# Patient Record
Sex: Female | Born: 1985 | Race: White | Hispanic: No | Marital: Married | Smoking: Former smoker
Health system: Southern US, Community
[De-identification: ages and names within clinical notes are randomized; demographics above are authoritative.]

## PROBLEM LIST (undated history)

## (undated) ENCOUNTER — Inpatient Hospital Stay (HOSPITAL_COMMUNITY): Payer: Self-pay

## (undated) DIAGNOSIS — Z789 Other specified health status: Secondary | ICD-10-CM

## (undated) HISTORY — PX: DILATION AND CURETTAGE OF UTERUS: SHX78

---
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
## 2003-06-15 ENCOUNTER — Other Ambulatory Visit: Admission: RE | Admit: 2003-06-15 | Discharge: 2003-06-15 | Payer: Self-pay | Admitting: Obstetrics & Gynecology

## 2004-07-11 ENCOUNTER — Other Ambulatory Visit: Admission: RE | Admit: 2004-07-11 | Discharge: 2004-07-11 | Payer: Self-pay | Admitting: Obstetrics and Gynecology

## 2007-05-28 ENCOUNTER — Ambulatory Visit: Payer: Self-pay | Admitting: Family Medicine

## 2007-05-28 ENCOUNTER — Observation Stay (HOSPITAL_COMMUNITY): Admission: EM | Admit: 2007-05-28 | Discharge: 2007-05-29 | Payer: Self-pay | Admitting: Emergency Medicine

## 2008-01-15 ENCOUNTER — Emergency Department (HOSPITAL_COMMUNITY): Admission: EM | Admit: 2008-01-15 | Discharge: 2008-01-15 | Payer: Self-pay | Admitting: Family Medicine

## 2009-02-28 ENCOUNTER — Emergency Department (HOSPITAL_COMMUNITY): Admission: EM | Admit: 2009-02-28 | Discharge: 2009-02-28 | Payer: Self-pay | Admitting: Family Medicine

## 2010-06-07 ENCOUNTER — Ambulatory Visit (HOSPITAL_COMMUNITY): Admission: AD | Admit: 2010-06-07 | Discharge: 2010-06-07 | Payer: Self-pay | Admitting: Obstetrics & Gynecology

## 2010-06-27 DEATH — deceased

## 2010-11-08 LAB — CBC
MCH: 32.9 pg (ref 26.0–34.0)
MCHC: 34.6 g/dL (ref 30.0–36.0)
MCV: 95.1 fL (ref 78.0–100.0)
Platelets: 258 10*3/uL (ref 150–400)
RDW: 13 % (ref 11.5–15.5)

## 2010-11-08 LAB — URINALYSIS, ROUTINE W REFLEX MICROSCOPIC
Bilirubin Urine: NEGATIVE
Glucose, UA: NEGATIVE mg/dL
Ketones, ur: NEGATIVE mg/dL
Leukocytes, UA: NEGATIVE
Nitrite: NEGATIVE
Protein, ur: NEGATIVE mg/dL
Specific Gravity, Urine: >1.03 — ABNORMAL HIGH (ref 1.005–1.030)
Urobilinogen, UA: 0.2 mg/dL (ref 0.0–1.0)
pH: 6 (ref 5.0–8.0)

## 2010-11-08 LAB — URINE MICROSCOPIC-ADD ON

## 2010-11-29 ENCOUNTER — Other Ambulatory Visit: Payer: Self-pay | Admitting: Obstetrics and Gynecology

## 2010-12-15 ENCOUNTER — Ambulatory Visit (HOSPITAL_COMMUNITY)
Admission: RE | Admit: 2010-12-15 | Discharge: 2010-12-15 | Disposition: A | Discharge status: Home / Self Care | Payer: 59 | Source: Ambulatory Visit | Attending: Obstetrics & Gynecology | Admitting: Obstetrics & Gynecology

## 2010-12-15 ENCOUNTER — Other Ambulatory Visit: Payer: Self-pay | Admitting: Obstetrics & Gynecology

## 2010-12-15 DIAGNOSIS — O021 Missed abortion: Secondary | ICD-10-CM | POA: Insufficient documentation

## 2010-12-15 LAB — CBC
MCHC: 34.6 g/dL (ref 30.0–36.0)
Platelets: 299 10*3/uL (ref 150–400)
RDW: 12.5 % (ref 11.5–15.5)
WBC: 11.4 10*3/uL — ABNORMAL HIGH (ref 4.0–10.5)

## 2010-12-16 NOTE — Op Note (Signed)
  [name], [name]              ACCOUNT NO.:  [id number]  MEDICAL RECORD NO.:  [id number]           PATIENT TYPE:  O  LOCATION:  [facility]                          [facility]  PHYSICIAN:  Gerrit Friends. Aldona Bar, M.D.   DATE OF BIRTH:  [birth year]  DATE OF PROCEDURE:  12/15/2010 DATE OF DISCHARGE:                              OPERATIVE REPORT   PREOPERATIVE DIAGNOSIS:  First trimester pregnancy loss, blood type A positive.  POSTOPERATIVE DIAGNOSIS:  First trimester pregnancy loss, blood type A positive.  PATHOLOGY:  Pending.  PROCEDURE:  Suction, dilatation, and curettage.  SURGEON:  Gerrit Friends. Aldona Bar, MD  ANESTHESIA:  Intravenous conscious sedation.  HISTORY:  This 25 year old gravida 3, para 1-0-4-1 presented to the office on December 13, 2010, having had an ultrasound which was consistent with a first trimester pregnancy loss.  She is now being taken to the OR for a suction, dilatation, and curettage to evacuate this pregnancy.  OPERATIVE PROCEDURE:  The patient was taken to the operating room where after satisfactory induction of intravenous conscious sedation, she was prepped and draped having placed in the modified lithotomy position in short Allen stirrups.  She was prepped and draped in usual fashion.  She previously emptied her bladder in [facility] Admissions and she was not catheterized.  At this time, a speculum was placed and a single-tooth tenaculum placed on the anterior lip of the cervix.  A paracervical block was carried out with approximately 15 mL of 1% Xylocaine without epinephrine.  The internal os was then dilated to a #25 Pratt dilator and thereafter using #8 suction tip, the cavity was thoroughly, gently, and systematically evacuated of all products of conception.  Curettage with a small standard curette confirmed what was felt to be complete evacuation and re-suctioning produced no additional tissue.  At this time, the procedure was felt to be complete.  All  instruments were removed.  The uterus at this time was firm and barely upper limits of normal size.  Bleeding was minimal.  The patient will be discharged to home with appropriate instructions and prescription for doxycycline 100 mg to use twice daily for total of 5 days to avoid infection.  She will return to the office for followup in approximately 2 weeks' time or as needed.  She will be given an instruction sheet at time of discharge from the step-down unit.  CONDITION ON ARRIVAL TO RECOVERY ROOM:  Satisfactory.  Pathologic specimen consisted of products of conception.  All counts correct x2.     Gerrit Friends. Aldona Bar, M.D.     RMW/MEDQ  D:  12/15/2010  T:  12/15/2010  Job:  [id number]  Electronically Signed by Annamaria Helling M.D. on 01/03/2011 07:16:50 AM

## 2010-12-26 DEATH — deceased

## 2011-01-09 NOTE — Discharge Summary (Signed)
[name], [name]               ACCOUNT NO.:  [id number]   MEDICAL RECORD NO.:  [id number]          PATIENT TYPE:  INP   LOCATION:  2031                         FACILITY:  [facility]   PHYSICIAN:  Wayne A. Sheffield Slider, M.D.    DATE OF BIRTH:  28-Jul-1986   DATE OF ADMISSION:  05/28/2007  DATE OF DISCHARGE:  05/29/2007                               DISCHARGE SUMMARY   DISCHARGE DIAGNOSES:  1. Presyncope.  2. Urinary tract infection.   CONSULTS:  None.   PROCEDURES:  1. Chest x-ray showing no acute cardiopulmonary process.  2. EKG showing sinus rhythm with sinus arrhythmias.  3. Repeat EKG in the morning:  No change.   DISCHARGE LABS:  Sodium 138, potassium 3.6, chloride 104, bicarb 26, BUN  11, creatinine 0.72, glucose 88, calcium 9.5, total bilirubin 0.5.  ALP  54, AST 14, ALT 26, total protein 6.9, albumin 3.8.  White blood cells  11.2, hemoglobin 14, hematocrit 40.6, platelets 268.  Urine pregnancy  test negative.  D-dimer less than 0.22.  Cardiac enzymes negative x2.  TSH 0.77.  Urine drug screen negative.  Urinalysis showing large  leukocyte esterase, many bacteria, many epithelial cells and 7-10 white  blood cells.   BRIEF HISTORY AND PHYSICAL:  For full summary, please see dictated H&P.  In short, this is a 25 year old female with no significant past medical  history who presents with presyncope.   1. Presyncope, likely vasovagal versus hypoglycemic given patient's      associated multiple episodes of presyncope with decreased p.o.      intake during the day, and patient was for the most part standing      up during episodes.  EKG showing no cardiac etiology.  Given      patient's history of similar episodes of syncope and presyncope in      daughter and history of recurrent presyncopal episodes in patient,      consider cardiology referral as outpatient.  Patient to follow up      within two weeks at [facility].  Cardiac workup during this      hospitalization nonrevealing.  2.  Urinary tract infection.  During hospitalization, patient was found      to have a urinary tract infection.  Keflex 500 mg t.i.d. was      started.  Patient to complete a seven-day course.   DISCHARGE MEDICATIONS:  1. Keflex 500 mg p.o. t.i.d. x7 days.  2. Implanon.   FOLLOWUP:  Patient to follow up with Dr. Corliss Marcus on Thursday,  October 9, at 11:15 a.m. at the family practice center.   DISCHARGE INSTRUCTIONS:  1. Please ensure you eat adequately.  2. Call your doctor or return to the ER when or if you again feel      dizzy like you did, or if you feel like you are going to pass out.  3. We would like you to follow up with [place] within the next two weeks;      please arrive 15 minutes prior to appointment to fill out      paperwork, as you will be  a new patient.   ISSUES FOR FOLLOWUP:  1. Resolution of UTI.  2. The 2-D echo results.  3. Consider cardiology referral.      Norma Boyden, MD  Electronically Signed      Norma [name]. Sheffield Slider, M.D.  Electronically Signed    JG/MEDQ  D:  05/29/2007  T:  05/29/2007  Job:  [id number]

## 2011-01-09 NOTE — H&P (Signed)
Norma [name], CENTNER NO.:  [id number]   MEDICAL RECORD NO.:  [id number]          PATIENT TYPE:  EMS   LOCATION:  [facility]                         FACILITY:  [facility]   PHYSICIAN:  Alanson Puls, M.D.    DATE OF BIRTH:  1986-06-08   DATE OF ADMISSION:  05/28/2007  DATE OF DISCHARGE:                              HISTORY & PHYSICAL   PRIMARY CARE PHYSICIAN:  None.   CHIEF COMPLAINT:  Recurrent syncope.   HISTORY OF PRESENT ILLNESS:  This is 25 year old with recurrent syncopal  episodes.  The patient had a syncopal episode today while at work.  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  The patient  states that her body went limp and that she felt faint.  There was no  loss of consciousness and this episode lasted about 10-15 minutes.  Per  report, the patient appeared pale to her colleagues and had a heart rate  in the 40s with a blood pressure 102/62, sating 97% on room air and her  blood sugar was checked and was 99.  The patient states this is at least  the third episode in one year.  These episodes usually occur every 4-5  months.  She states that they are usually associated with decreased p.o.  intake, and she thought this was secondary to hypoglycemia.  She has no  history of seizure or migraine.  She has never had any loss of bowel or  bladder or loss of consciousness with these episodes.  She denies any  chest pain, palpitations or shortness of breath, and her EKG in the  emergency department showed sinus arrhythmia with a sinus rhythm at 68  beats per minute and normal intervals.   PAST MEDICAL HISTORY:  Gestational diabetes with birth of a live child  in 2004.   PAST SURGICAL HISTORY:  She has had no surgeries.   MEDICATIONS:  Implanon which was implanted in August 2007.   FAMILY HISTORY:  Significant for  mother with hypercholesterolemia.  Father with hypertension and a brother who is healthy.  She does have an  aunt who had coronary artery disease status post CABG around age 53.  Her paternal grandfather had heart valve replacement in his 6s as well  as maternal grandfather who had a stroke as well as MI by the age of 66.   SOCIAL HISTORY:  The patient does live in [place] with her fiance and  smokes two cigarettes daily for the past 15 years she denies any alcohol  or marijuana use.  States that she is under significant stress with her  69-year-old daughter who is having some type of arrhythmia as well, but  her arrhythmia seems to be more of a tachycardia, and she is being  followed by a pediatric cardiologist.   ALLERGIES:  No known drug allergies.   REVIEW OF SYSTEMS:  Significant for 17 pounds weight loss within last 2  months.  The patient states some of this is intentional.  However, she  does not admit to purging or binging.  She states she has also had  occasional nonbilious nonbloody emesis at least weekly, but no changes  in her stool.  She states that this is not related to any particular  meal.   PHYSICAL EXAMINATION:  VITAL SIGNS:  Temperature 97, blood pressure  111/59, heart rate 64, respirations 20, 97% on room air.  Orthostatic  vitals were obtained.  Her lying blood pressure was 113/55, heart rate  of 64, standing 113/63 with a heart rate of 81.  In general, she is  alert and awake and oriented in no acute distress.  HEENT: Pupils are equal, round and reactive.  Extraocular muscles are  intact.  Visual fields are to confrontation.  CARDIAC:  About 68-70 beats per minute.  No murmurs appreciated.  No  gallops appreciated.  No carotid bruits.  PULMONARY:  She is clear to auscultation bilaterally.  ABDOMEN:  Soft, nontender with positive bowel sounds and no guarding.  EXTREMITIES:  No edema or swelling noted.  NEUROLOGICAL:  Deep tendon reflexes 2+ with down going  Babinski sign,  5/5 upper extremity strength and lower extremity strength and finger-to-  nose was intact.   LABORATORY DATA:  White blood cell count 9.5, hemoglobin 13.7, platelets  258, 62% segs.  Sodium 138, potassium 3.5, chloride 104, bicarb 25, BUN  10, creatinine 0.7, glucose of 100, magnesium 2.1.  Point of care  enzymes:  Myoglobin 50.8, second set 61.9, troponin I less than 0.05  both sets and CK-MB less than one both sets.  Her EKG here showed normal  sinus rhythm at 63 beats per minute with a PR of 146, a QRS of 88 and  QTc of 388.  Chest x-ray showed no acute cardiopulmonary abnormalities.   ASSESSMENT/PLAN:  This is a 25 year old with syncope.  We want to rule  out a cardiovascular cause given her young age and with intermittent  bradycardia as reported.  We will cycle her cardiac enzymes q.8 x2.  She  did have a normal cardiac and neurological exam.  We will order an echo  to evaluate for valvular abnormalities versus structural cause.  We will  also check a D-dimer given she has a low probability, but she has  tobacco use as well as hormone use with progestin through her Implanon.  There is a question of whether her orthostatics are borderline.  However, we will encourage p.o. hydration given that she wants to eat.  We will further risk stratify her by checking a cholesterol given family  history of premature coronary artery disease as well as check a TSH  given report of bradycardia in a urine drug screen.  We will rule out  pregnancy by checking a urine pregnancy.   DISPOSITION:  Pending evaluation of her syncope.      Alanson Puls, M.D.  Electronically Signed     MR/MEDQ  D:  05/28/2007  T:  05/29/2007  Job:  [id number]

## 2011-01-12 NOTE — Discharge Summary (Signed)
NAME:  KY, MOSKOWITZ                         ACCOUNT NO.:  [id number]   MEDICAL RECORD NO.:  [id number]                   PATIENT TYPE:  INP   LOCATION:  9157                                 [facility]   PHYSICIAN:  Gerrit Friends. Aldona Bar, M.D.                DATE OF BIRTH:  08-14-1986   DATE OF ADMISSION:  11/13/2002  DATE OF DISCHARGE:  11/17/2002                                 DISCHARGE SUMMARY   DISCHARGE DIAGNOSES:  1. Thirty-three week pregnancy, undelivered.  2. Blood type A positive.  3. Preterm labor, arrested.   SUMMARY:  This 25 year old primigravida with a due date of Jan 03, 2003  presented with contractions.  Her pregnancy has been complicated by diet  controlled gestational diabetes.  At the time of admission she was felt to  be 2-3 cm dilated, 50% effaced, with a vertex at -1 station.  Fetal heart  was reactive and regular contractions were noted.  She was admitted,  cultured for group B strep, placed on penicillin prophylaxis, begun on  magnesium sulfate, and given betamethasone.  She responded well to the  magnesium sulfate.  Her contractions decreased in frequency.  On March 21  she was begun on Procardia 20 mg three times a day.  Her group B strep  culture was negative and penicillin was discontinued.  On March 22 she was  having very occasional contractions.  She was doing well on Procardia.  She  was having some variables on the fetal heart monitor.  A biophysical profile  was done and was 8/10 (-2 for breathing).  Fetal Dopplers were normal.  Amniotic fluid volume was felt to be decreased.  She was hydrated with IV  fluids on March 22.  On the morning of March 23 the baby was quite reactive  and no decelerations were noted on the fetal monitor, and she was having  rare contractions.  She was examined and her cervix was found to be 1-2 cm  dilated, about 70% effaced, with the vertex at -1 station.  It was felt at  this point that maximum hospitalization had  been achieved.  She does not  live far away from the hospital and therefore was discharged to home at  relative rest, to continue her Procardia 20 mg q.8h., and return as needed  or return to the office in approximately a week's time.  She was given  careful, explicit instructions at the time of discharge and understood all  instructions well.   MEDICATIONS AT TIME OF DISCHARGE:  1. Prenatal vitamins one a day.  2. Procardia 20 mg q.8h.   CONDITION ON DISCHARGE:  Improved.  Gerrit Friends. Aldona Bar, M.D.    RMW/MEDQ  D:  11/17/2002  T:  11/17/2002  Job:  [id number]

## 2011-06-07 LAB — DIFFERENTIAL
Basophils Relative: 1
Lymphocytes Relative: 31
Monocytes Relative: 6
Neutro Abs: 5.9
Neutrophils Relative %: 62

## 2011-06-07 LAB — LIPID PANEL
Cholesterol: 135
HDL: 21 — ABNORMAL LOW
Triglycerides: 139

## 2011-06-07 LAB — I-STAT 8, (EC8 V) (CONVERTED LAB)
Acid-base deficit: 1
Operator id: 285841
Potassium: 3.5
TCO2: 27
pCO2, Ven: 45.4
pH, Ven: 7.353 — ABNORMAL HIGH

## 2011-06-07 LAB — RAPID URINE DRUG SCREEN, HOSP PERFORMED: Tetrahydrocannabinol: NOT DETECTED

## 2011-06-07 LAB — COMPREHENSIVE METABOLIC PANEL
ALT: 26
AST: 14
Alkaline Phosphatase: 54
CO2: 26
Chloride: 104
Creatinine, Ser: 0.72
GFR calc Af Amer: >60
GFR calc non Af Amer: >60
Total Bilirubin: 0.5

## 2011-06-07 LAB — POCT I-STAT CREATININE
Creatinine, Ser: 0.7
Operator id: 285841

## 2011-06-07 LAB — URINALYSIS, ROUTINE W REFLEX MICROSCOPIC
Nitrite: NEGATIVE
Specific Gravity, Urine: 1.012
Urobilinogen, UA: 0.2

## 2011-06-07 LAB — TROPONIN I: Troponin I: 0.01

## 2011-06-07 LAB — CBC
MCHC: 34.5
MCV: 91.7
RBC: 4.26
RBC: 4.43
WBC: 11.2 — ABNORMAL HIGH
WBC: 9.5

## 2011-06-07 LAB — POCT CARDIAC MARKERS
CKMB, poc: <1 — ABNORMAL LOW
Operator id: 285841
Troponin i, poc: <0.05
Troponin i, poc: <0.05

## 2011-06-07 LAB — URINE MICROSCOPIC-ADD ON

## 2011-06-07 LAB — CARDIAC PANEL(CRET KIN+CKTOT+MB+TROPI): Relative Index: INVALID

## 2011-06-07 LAB — TSH: TSH: 0.771

## 2011-06-21 IMAGING — US US OB COMP LESS 14 WK
1 series · 14 of 28 positions shown · non-contrast
Comparison: none

CLINICAL DATA: Vaginal bleeding.  First trimester pregnancy.

OBSTETRIC <14 WK US AND TRANSVAGINAL OB US
TECHNIQUE: Both transabdominal and transvaginal ultrasound
examinations were performed for complete evaluation of the
gestation as well as the maternal uterus, adnexal regions, and
pelvic cul-de-sac.

[Series 1: us ob comp less 14 wks · 0.19mm/px · 37 acquisitions, 14 frames shown]
[im 2/37]
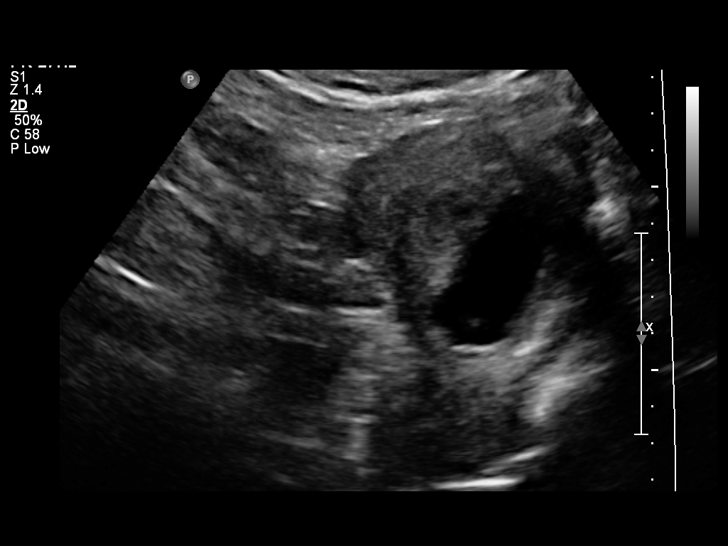
[im 5/37]
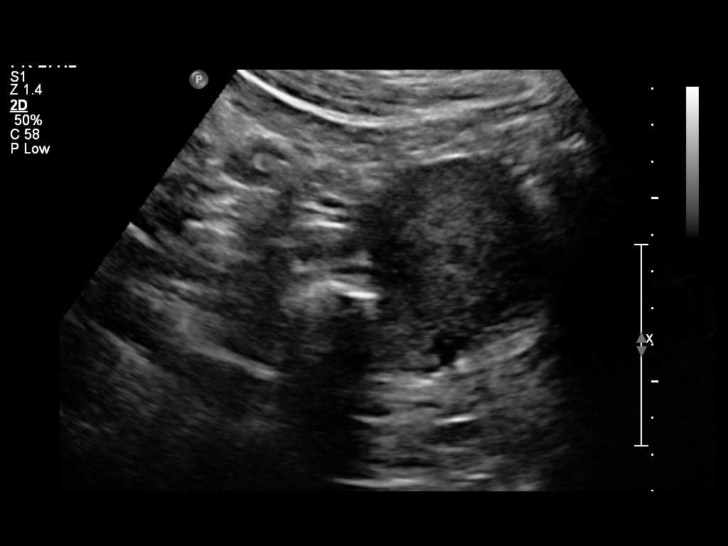
[im 7/37]
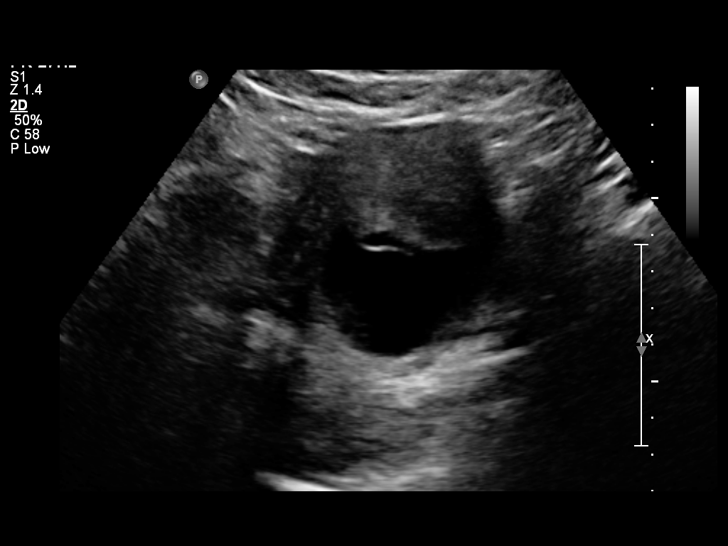
[im 10/37]
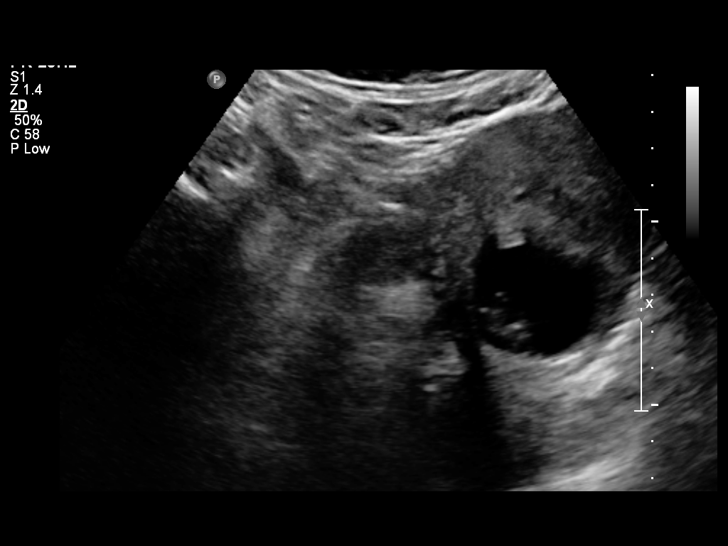
[im 13/37]
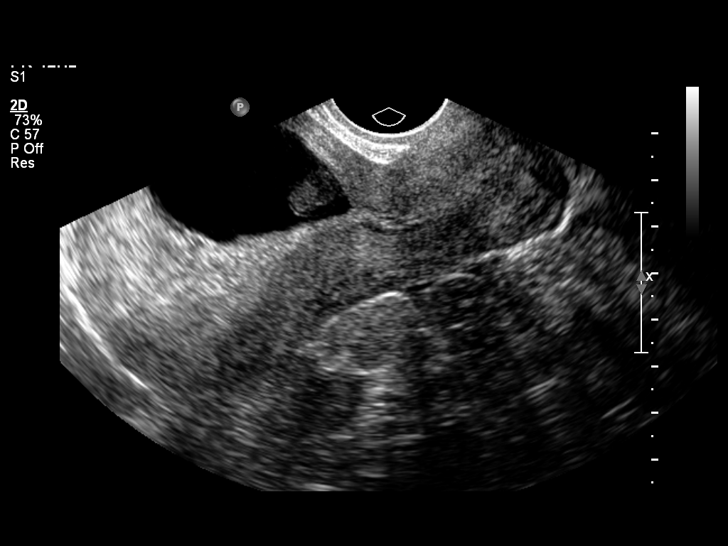
[im 15/37]
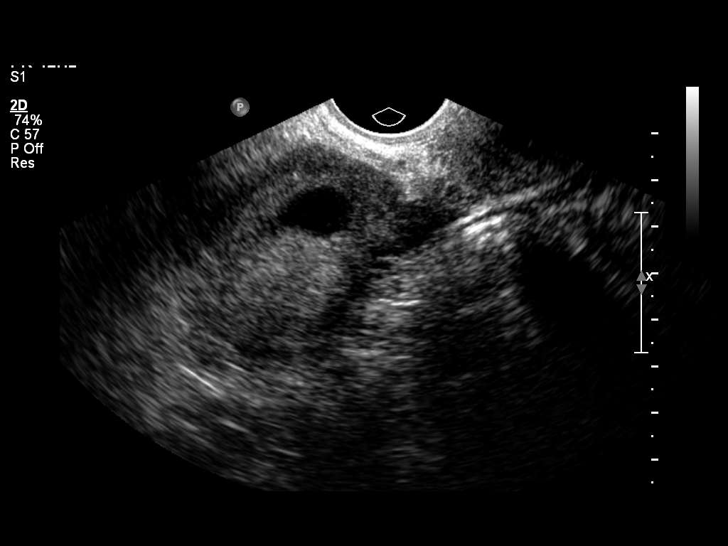
[im 18/37]
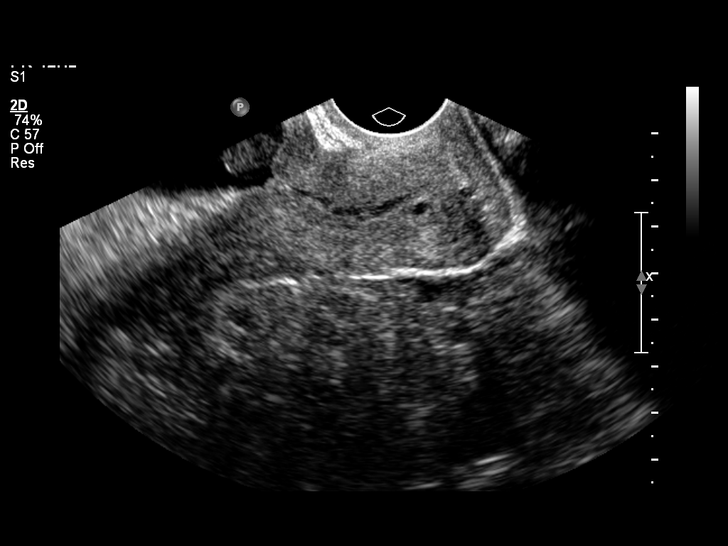
[im 21/37]
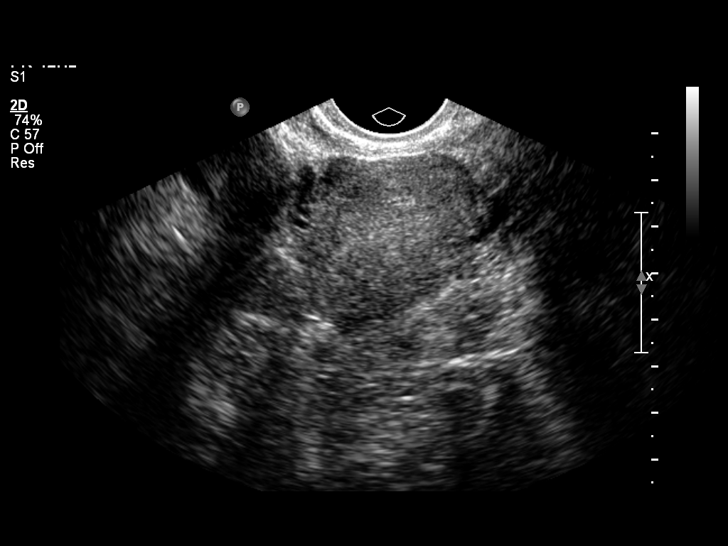
[im 23/37]
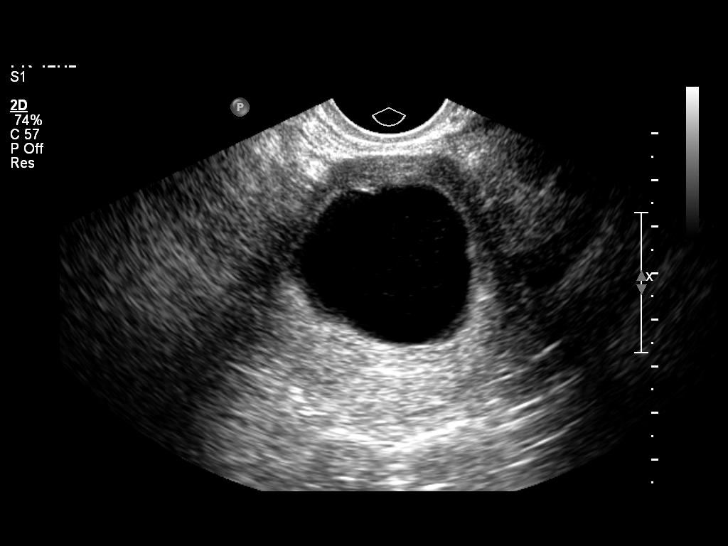
[im 26/37]
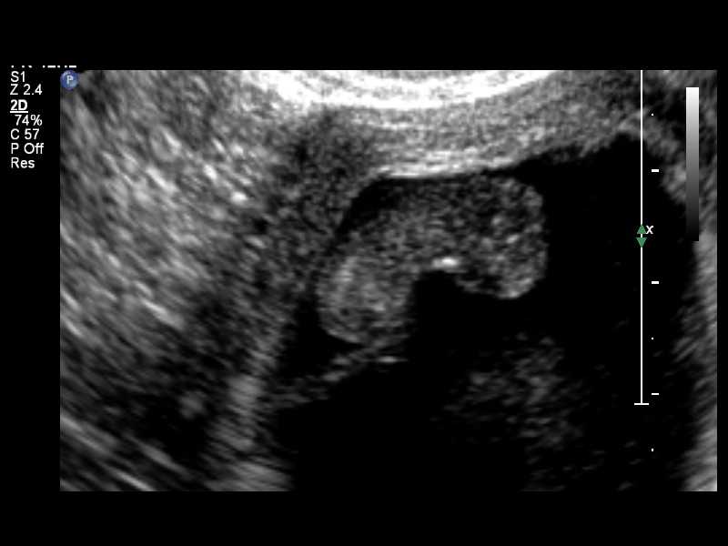
[im 29/37]
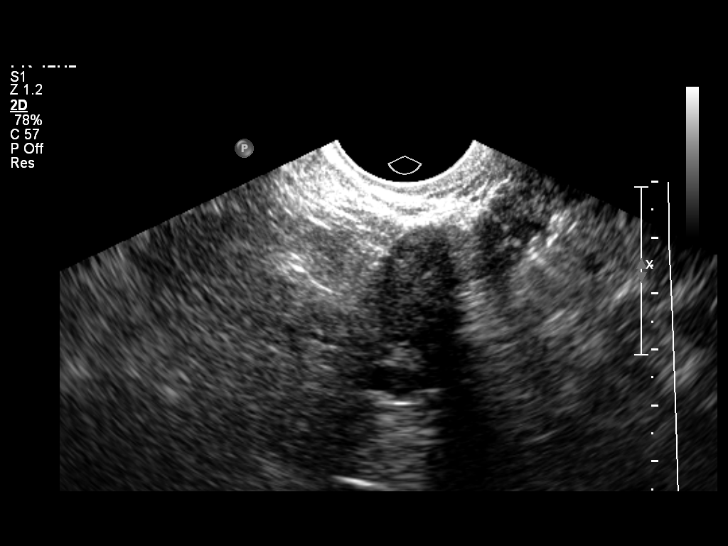
[im 31/37]
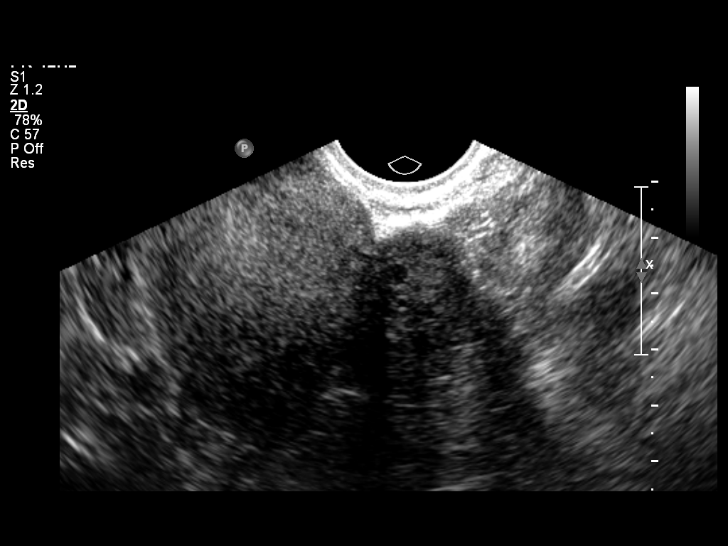
[im 34/37]
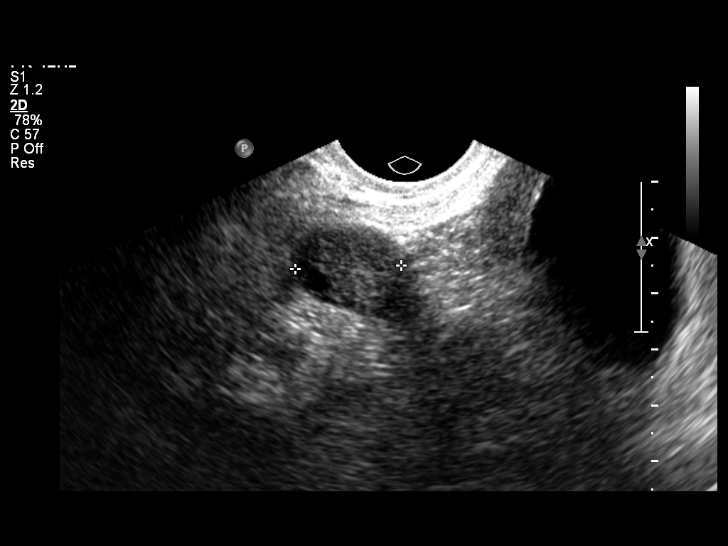
[im 37/37]
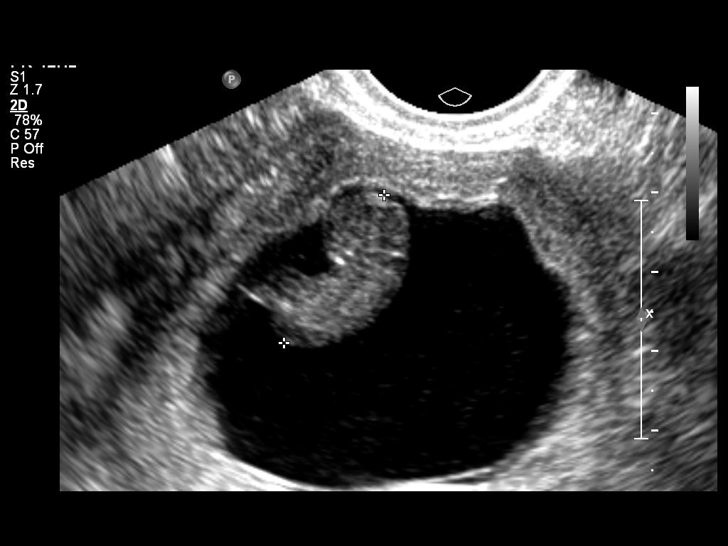

[14 of 28 positions shown; findings below may reference images not displayed]

FINDINGS: An irregular intrauterine gestational sac is identified.

Within this sac, an embryo is noted, with crown-rump length of
cm which would be compatible with 9 weeks 0 days gestation.
However, no embryonic cardiac activity is identified.  The
appearance is diagnostic of demise.

Yolk sac not visualized.

No obvious subchorionic hemorrhage is currently identified.
Maternal ovaries appear unremarkable.

No free pelvic fluid identified.
IMPRESSION: 1.  Irregular intrauterine gestational sac, with embryo measuring 9
weeks 0 days, but with no embryonic cardiac activity.  The
appearance is diagnostic for demise.

## 2011-06-27 ENCOUNTER — Inpatient Hospital Stay (HOSPITAL_COMMUNITY)
Admission: AD | Admit: 2011-06-27 | Discharge: 2011-06-27 | Disposition: A | Discharge status: Home / Self Care | Payer: 59 | Source: Ambulatory Visit | Attending: Obstetrics & Gynecology | Admitting: Obstetrics & Gynecology

## 2011-06-27 ENCOUNTER — Encounter (HOSPITAL_COMMUNITY): Payer: Self-pay | Admitting: *Deleted

## 2011-06-27 DIAGNOSIS — O26899 Other specified pregnancy related conditions, unspecified trimester: Secondary | ICD-10-CM

## 2011-06-27 DIAGNOSIS — O99891 Other specified diseases and conditions complicating pregnancy: Secondary | ICD-10-CM | POA: Insufficient documentation

## 2011-06-27 DIAGNOSIS — R109 Unspecified abdominal pain: Secondary | ICD-10-CM

## 2011-06-27 DIAGNOSIS — N949 Unspecified condition associated with female genital organs and menstrual cycle: Secondary | ICD-10-CM | POA: Insufficient documentation

## 2011-06-27 HISTORY — DX: Other specified health status: Z78.9

## 2011-06-27 LAB — URINALYSIS, ROUTINE W REFLEX MICROSCOPIC
Nitrite: NEGATIVE
Protein, ur: NEGATIVE mg/dL
Specific Gravity, Urine: >1.03 — ABNORMAL HIGH (ref 1.005–1.030)
Urobilinogen, UA: 0.2 mg/dL (ref 0.0–1.0)

## 2011-06-27 LAB — URINE MICROSCOPIC-ADD ON

## 2011-06-27 NOTE — ED Provider Notes (Signed)
History     CSN: [id number] Arrival date & time: 06/27/2011  8:07 PM   None     Chief Complaint  Patient presents with  . Vaginal Pain    HPI Norma [name] is a 25 y.o. female @ 8.[redacted] weeks gestation by her LMP of 04/28/11. Had first prenatal visit 06/19/11 in the office and had viable IUP on ultrasound. History of SAB x 2. Last night began having mild lower abdominal pain but today pain has gotten worse. Patient very concerned that she may be trying to miscarry again. Denies bleeding or discharge. The history was provided by the patient.   No past medical history on file.  No past surgical history on file.  No family history on file.  History  Substance Use Topics  . Smoking status: Not on file  . Smokeless tobacco: Not on file  . Alcohol Use: Not on file    OB History    Grav Para Term Preterm Abortions TAB SAB Ect Mult Living   1               Review of Systems  Constitutional: Negative for fever, chills, diaphoresis and fatigue.  HENT: Negative for ear pain, congestion, sore throat, facial swelling, neck pain, neck stiffness, dental problem and sinus pressure.   Eyes: Negative for photophobia, pain and discharge.  Respiratory: Negative for cough, chest tightness and wheezing.   Cardiovascular: Negative.   Gastrointestinal: Positive for nausea and abdominal pain. Negative for vomiting, diarrhea and constipation.  Genitourinary: Positive for frequency. Negative for dysuria, flank pain and difficulty urinating.  Musculoskeletal: Positive for back pain. Negative for myalgias and gait problem.  Skin: Negative for color change and rash.  Neurological: Positive for headaches. Negative for dizziness, speech difficulty, weakness, light-headedness and numbness.  Psychiatric/Behavioral: Negative for confusion and agitation. The patient is not nervous/anxious.     Allergies  Review of patient's allergies indicates no known allergies.  Home Medications  No current  outpatient prescriptions on file.  BP 152/73  Pulse 99  Temp(Src) 98 F (36.7 C) (Oral)  Resp 20  Ht 5\' 3"  (1.6 m)  Wt 179 lb (81.194 kg)  BMI 31.71 kg/m2  LMP 04/28/2011  Physical Exam  Nursing note and vitals reviewed. Constitutional: She is oriented to person, place, and time. She appears well-developed and well-nourished.  HENT:  Head: Normocephalic and atraumatic.  Eyes: EOM are normal.  Neck: Neck supple.  Pulmonary/Chest: Effort normal.  Abdominal: Soft. There is no tenderness.  Genitourinary:       White vaginal discharge, no bleeding noted. Cervix closed, no CMT, no adnexal tenderness. Uterus approximately 8 week size.  Musculoskeletal: Normal range of motion.  Neurological: She is alert and oriented to person, place, and time. No cranial nerve deficit.  Skin: Skin is warm and dry.  Psychiatric: She has a normal mood and affect. Her behavior is normal. Judgment and thought content normal.   Note: Informal bedside ultrasound shows IUP with cardiac activity.  ED Course: Consult with Dr. Aldona Bar, will d/c patient home to follow up in the office.   Procedures  Assessment: Viable IUP   Pelvic cramping  Plan:  Tylenol for discomfort    Follow up in the office   Return here as needed.  MDM          Kerrie Buffalo, NP 06/27/11 2104

## 2011-06-27 NOTE — Progress Notes (Signed)
Pt states she has a stabbing pain in her abd that started last night and got much worse today

## 2011-06-28 NOTE — ED Provider Notes (Signed)
Discussed with NP before patient discharged to home.

## 2011-07-08 LAB — OB RESULTS CONSOLE ABO/RH: RH Type: POSITIVE

## 2011-07-08 LAB — OB RESULTS CONSOLE RPR: RPR: NONREACTIVE

## 2011-08-28 NOTE — L&D Delivery Note (Signed)
Patient was C/C/+4 and pushed for <5 minutes with no epidural.   NSVD  female infant, Apgars 8/9, weight 5#10.   The patient had no lacerations. Fundus was firm. EBL was expected. Cord evulsed, manual extraction of placenta intact performed Vagina was clear.  Baby was vigorous to bedside.  Philip Aspen

## 2011-12-29 ENCOUNTER — Encounter (HOSPITAL_COMMUNITY): Payer: Self-pay | Admitting: *Deleted

## 2011-12-29 ENCOUNTER — Inpatient Hospital Stay (HOSPITAL_COMMUNITY)
Admission: AD | Admit: 2011-12-29 | Discharge: 2011-12-29 | Disposition: A | Discharge status: Home / Self Care | Payer: 59 | Source: Ambulatory Visit | Attending: Obstetrics and Gynecology | Admitting: Obstetrics and Gynecology

## 2011-12-29 DIAGNOSIS — R51 Headache: Secondary | ICD-10-CM | POA: Insufficient documentation

## 2011-12-29 DIAGNOSIS — R030 Elevated blood-pressure reading, without diagnosis of hypertension: Secondary | ICD-10-CM | POA: Insufficient documentation

## 2011-12-29 DIAGNOSIS — O99891 Other specified diseases and conditions complicating pregnancy: Secondary | ICD-10-CM | POA: Insufficient documentation

## 2011-12-29 LAB — URINALYSIS, ROUTINE W REFLEX MICROSCOPIC
Bilirubin Urine: NEGATIVE
Glucose, UA: NEGATIVE mg/dL
Hgb urine dipstick: NEGATIVE
Protein, ur: NEGATIVE mg/dL
Specific Gravity, Urine: >1.03 — ABNORMAL HIGH (ref 1.005–1.030)
Urobilinogen, UA: 0.2 mg/dL (ref 0.0–1.0)

## 2011-12-29 LAB — COMPREHENSIVE METABOLIC PANEL
BUN: 7 mg/dL (ref 6–23)
CO2: 22 mEq/L (ref 19–32)
Calcium: 9.2 mg/dL (ref 8.4–10.5)
Chloride: 104 mEq/L (ref 96–112)
Creatinine, Ser: 0.49 mg/dL — ABNORMAL LOW (ref 0.50–1.10)
GFR calc Af Amer: >90 mL/min (ref >90)
GFR calc non Af Amer: >90 mL/min (ref >90)
Total Bilirubin: 0.2 mg/dL — ABNORMAL LOW (ref 0.3–1.2)

## 2011-12-29 LAB — CBC
HCT: 35.4 % — ABNORMAL LOW (ref 36.0–46.0)
MCH: 30.7 pg (ref 26.0–34.0)
MCV: 89.8 fL (ref 78.0–100.0)
Platelets: 189 10*3/uL (ref 150–400)
RBC: 3.94 MIL/uL (ref 3.87–5.11)
WBC: 10.2 10*3/uL (ref 4.0–10.5)

## 2011-12-29 LAB — URIC ACID: Uric Acid, Serum: 3.3 mg/dL (ref 2.4–7.0)

## 2011-12-29 MED ORDER — BUTALBITAL-APAP-CAFFEINE 50-325-40 MG PO TABS: 1.0000 | ORAL_TABLET | Freq: Four times a day (QID) | ORAL | Status: AC | PRN

## 2011-12-29 MED ORDER — BUTALBITAL-APAP-CAFFEINE 50-325-40 MG PO TABS
2.0000 | ORAL_TABLET | Freq: Once | ORAL | Status: AC
  Administered 2011-12-29: 2 via ORAL
  Filled 2011-12-29: qty 2

## 2011-12-29 NOTE — MAU Note (Signed)
Pt reports BP 151/91 at home. Pt also reports h/a since yesterday afternoon. Pt reports seeing floaters off and on for a "couple of weeks" and did see some this am.

## 2011-12-29 NOTE — ED Provider Notes (Signed)
First Provider Initiated Contact with Patient 12/29/11 1707      Chief Complaint:  Hypertension   Norma [name] is  26 y.o. [id number] at [redacted]w[redacted]d presents complaining of Hypertension She states that she took her b/p at home at it was 151/91.  SHe turned in a 24 hour urine yesterday afternoon, but results are unavailable at this time.  Pt also c/o a headache unresponsive to tylenol, and seeing occasional floaters (for the past 2 weeks).  States face is swelling.   Menstrual History: OB History    Grav Para Term Preterm Abortions TAB SAB Ect Mult Living   4 1 1  2  2   1        Patient's last menstrual period was 04/28/2011.     Past Medical History: Past Medical History  Diagnosis Date  . No pertinent past medical history     Past Surgical History: Past Surgical History  Procedure Date  . Dilation and curettage of uterus     Family History: Family History  Problem Relation Age of Onset  . Anesthesia problems Neg Hx     Social History: History  Substance Use Topics  . Smoking status: Former Smoker    Types: Cigarettes    Quit date: 10/28/2008  . Smokeless tobacco: Not on file  . Alcohol Use: No    Allergies: No Known Allergies  Meds:  Prescriptions prior to admission  Medication Sig Dispense Refill  . cetirizine (ZYRTEC) 10 MG tablet Take 10 mg by mouth daily.      Marland Kitchen FOLIC ACID PO Take 1 capsule by mouth at bedtime. Pt does not know strength, gets OTC      . ranitidine (ZANTAC) 150 MG tablet Take 150 mg by mouth at bedtime.        Review of Systems - Please refer to the aforementioned patients' reports.     Physical Exam  Blood pressure 136/86, pulse 97, temperature 98.2 F (36.8 C), resp. rate 18, last menstrual period 04/28/2011. GENERAL: Well-developed, well-nourished female in no acute distress. Has a stuffy nose. LUNGS: Clear to auscultation bilaterally.  HEART: Regular rate and rhythm. ABDOMEN: Soft, nontender, nondistended, gravid.    EXTREMITIES: Nontender, no edema, 2+ distal pulses. CERVICAL EXAM:Deferred FHT:  Baseline rate 140 bpm   Variability moderate  Accelerations present   Decelerations none Contractions: Every 0 mins   Labs: Recent Results (from the past 24 hour(s))  URINALYSIS, ROUTINE W REFLEX MICROSCOPIC   Collection Time   12/29/11  4:35 PM      Component Value Range   Color, Urine YELLOW  YELLOW    APPearance CLEAR  CLEAR    Specific Gravity, Urine >1.030 (*) 1.005 - 1.030    pH 6.0  5.0 - 8.0    Glucose, UA NEGATIVE  NEGATIVE (mg/dL)   Hgb urine dipstick NEGATIVE  NEGATIVE    Bilirubin Urine NEGATIVE  NEGATIVE    Ketones, ur NEGATIVE  NEGATIVE (mg/dL)   Protein, ur NEGATIVE  NEGATIVE (mg/dL)   Urobilinogen, UA 0.2  0.0 - 1.0 (mg/dL)   Nitrite NEGATIVE  NEGATIVE    Leukocytes, UA NEGATIVE  NEGATIVE   CBC   Collection Time   12/29/11  5:23 PM      Component Value Range   WBC 10.2  4.0 - 10.5 (K/uL)   RBC 3.94  3.87 - 5.11 (MIL/uL)   Hemoglobin 12.1  12.0 - 15.0 (g/dL)   HCT 45.4 (*) 09.8 - 46.0 (%)   MCV 89.8  78.0 - 100.0 (fL)   MCH 30.7  26.0 - 34.0 (pg)   MCHC 34.2  30.0 - 36.0 (g/dL)   RDW 56.2  13.0 - 86.5 (%)   Platelets 189  150 - 400 (K/uL)   Imaging Studies:  No results found.  Assessment: Norma [name] is  26 y.o. [phone] at [redacted]w[redacted]d presents with elevated B/P's, no evidence of preeclampsia Headache is better Plan: Discussed with Dr. Henderson Cloud.  Pt discharged home with a salt restricted diet, preeclampsia precautions, and a Rx for Fioricet.  F/U in the office Monday as scheduled  CRESENZO-DISHMAN,Orrie Schubert 5/4/20135:54 PM

## 2011-12-29 NOTE — Discharge Instructions (Signed)

## 2012-01-01 ENCOUNTER — Encounter (HOSPITAL_COMMUNITY): Payer: Self-pay | Admitting: *Deleted

## 2012-01-01 ENCOUNTER — Inpatient Hospital Stay (HOSPITAL_COMMUNITY)
Admission: AD | Admit: 2012-01-01 | Discharge: 2012-01-01 | Disposition: A | Discharge status: Home / Self Care | Payer: 59 | Source: Ambulatory Visit | Attending: Obstetrics and Gynecology | Admitting: Obstetrics and Gynecology

## 2012-01-01 DIAGNOSIS — O133 Gestational [pregnancy-induced] hypertension without significant proteinuria, third trimester: Secondary | ICD-10-CM

## 2012-01-01 DIAGNOSIS — O139 Gestational [pregnancy-induced] hypertension without significant proteinuria, unspecified trimester: Secondary | ICD-10-CM | POA: Insufficient documentation

## 2012-01-01 LAB — URINALYSIS, ROUTINE W REFLEX MICROSCOPIC
Ketones, ur: NEGATIVE mg/dL
Leukocytes, UA: NEGATIVE
Nitrite: NEGATIVE
Protein, ur: 30 mg/dL — AB
Urobilinogen, UA: 1 mg/dL (ref 0.0–1.0)

## 2012-01-01 LAB — COMPREHENSIVE METABOLIC PANEL
Alkaline Phosphatase: 99 U/L (ref 39–117)
BUN: 7 mg/dL (ref 6–23)
Creatinine, Ser: 0.48 mg/dL — ABNORMAL LOW (ref 0.50–1.10)
GFR calc Af Amer: >90 mL/min (ref >90)
Glucose, Bld: 139 mg/dL — ABNORMAL HIGH (ref 70–99)
Potassium: 3.6 mEq/L (ref 3.5–5.1)
Total Bilirubin: 0.1 mg/dL — ABNORMAL LOW (ref 0.3–1.2)
Total Protein: 5.4 g/dL — ABNORMAL LOW (ref 6.0–8.3)

## 2012-01-01 LAB — CBC
HCT: 34.7 % — ABNORMAL LOW (ref 36.0–46.0)
Hemoglobin: 11.7 g/dL — ABNORMAL LOW (ref 12.0–15.0)
MCH: 30.2 pg (ref 26.0–34.0)
MCHC: 33.7 g/dL (ref 30.0–36.0)
MCV: 89.4 fL (ref 78.0–100.0)

## 2012-01-01 LAB — LACTATE DEHYDROGENASE: LDH: 144 U/L (ref 94–250)

## 2012-01-01 LAB — URIC ACID: Uric Acid, Serum: 3.1 mg/dL (ref 2.4–7.0)

## 2012-01-01 LAB — URINE MICROSCOPIC-ADD ON

## 2012-01-01 MED ORDER — OXYCODONE HCL 5 MG PO TABS: 10.0000 mg | ORAL_TABLET | Freq: Once | ORAL | Status: DC

## 2012-01-01 NOTE — MAU Note (Signed)
Pt presents to MAU via ems due to elevated bp's states she has been monitoring it at home as it has been up with the pregnancy.  States she has just not felt good all day.  Was seen in [facility] on sat.

## 2012-01-01 NOTE — MAU Note (Signed)
V. Smith, CNM at bedside.  Assessment done and poc discussed with pt.  

## 2012-01-01 NOTE — MAU Provider Note (Signed)
Chief Complaint:  Hypertension  First Provider Initiated Contact with Patient 01/01/12 1956    HPI  Norma [name] is  26 y.o. [id number] at [redacted]w[redacted]d presents by EMS for elevated BP per home cuff. BP in ambulance was ~180/110 then ~120/70. She reports HA and seeing floaters today and denies epigastric pain, contractions, leakage of fluid or vaginal bleeding. Good fetal movement. HA generalized, dull, L>R. No sinus pressure. Took Tylenol two hours before arrival w/ no improvement. Rates it 5/10.    Past Medical History: Past Medical History  Diagnosis Date  . No pertinent past medical history     Past Surgical History: Past Surgical History  Procedure Date  . Dilation and curettage of uterus     Family History: Family History  Problem Relation Age of Onset  . Anesthesia problems Neg Hx     Social History: History  Substance Use Topics  . Smoking status: Former Smoker    Types: Cigarettes    Quit date: 10/28/2008  . Smokeless tobacco: Not on file  . Alcohol Use: No    Allergies: No Known Allergies  Meds:  Prescriptions prior to admission  Medication Sig Dispense Refill  . butalbital-acetaminophen-caffeine (FIORICET) 50-325-40 MG per tablet Take 1-2 tablets by mouth every 6 (six) hours as needed for headache. Has not taken.  20 tablet  0  . cetirizine (ZYRTEC) 10 MG tablet Take 10 mg by mouth daily.      Marland Kitchen FOLIC ACID PO Take 1 capsule by mouth at bedtime. Pt does not know strength, gets OTC      . ranitidine (ZANTAC) 150 MG tablet Take 150 mg by mouth at bedtime.          Physical Exam  Blood pressure 132/74, pulse 75, height 5\' 3"  (1.6 m), weight 88.905 kg (196 lb), last menstrual period 04/28/2011. Patient Vitals for the past 24 hrs:  BP Pulse Height Weight  01/01/12 2211 123/68 mmHg 67  - -  01/01/12 2202 124/64 mmHg 59  - -  01/01/12 2151 121/87 mmHg 88  - -  01/01/12 2146 128/64 mmHg 73  - -  01/01/12 2132 124/78 mmHg 68  - -  01/01/12 2122 129/80 mmHg  83  - -  01/01/12 2111 132/86 mmHg 60  - -  01/01/12 2102 130/69 mmHg 79  - -  01/01/12 2052 127/85 mmHg 84  - -  01/01/12 2041 137/81 mmHg 78  - -  01/01/12 2031 121/72 mmHg 82  - -  01/01/12 2021 138/83 mmHg 76  - -  01/01/12 2012 118/64 mmHg 75  - -  01/01/12 2001 139/71 mmHg 77  - -  01/01/12 1951 133/72 mmHg 86  - -  01/01/12 1941 132/74 mmHg 75  - -  01/01/12 1934 139/73 mmHg 76  - -  01/01/12 1933 - - 5\' 3"  (1.6 m) 88.905 kg (196 lb)   GENERAL: Well-developed, well-nourished female in no acute distress.  HEENT: normocephalic HEART: normal rate RESP: normal effort ABDOMEN: Soft, nontender, nondistended, gravid.  EXTREMITIES: Nontender, Trace edema. DTR's 2+, no clonus NEURO: alert and oriented  SPECULUM EXAM: Deferred    FHT:  Baseline 140 , moderate variability, accelerations present, no decelerations Contractions: UI   Labs: Results for orders placed during the hospital encounter of 01/01/12 (from the past 24 hour(s))  URINALYSIS, ROUTINE W REFLEX MICROSCOPIC     Status: Abnormal   Collection Time   01/01/12  7:53 PM      Component Value Range  Color, Urine YELLOW  YELLOW    APPearance HAZY (*) CLEAR    Specific Gravity, Urine 1.025  1.005 - 1.030    pH 6.0  5.0 - 8.0    Glucose, UA 250 (*) NEGATIVE (mg/dL)   Hgb urine dipstick NEGATIVE  NEGATIVE    Bilirubin Urine NEGATIVE  NEGATIVE    Ketones, ur NEGATIVE  NEGATIVE (mg/dL)   Protein, ur 30 (*) NEGATIVE (mg/dL)   Urobilinogen, UA 1.0  0.0 - 1.0 (mg/dL)   Nitrite NEGATIVE  NEGATIVE    Leukocytes, UA NEGATIVE  NEGATIVE   URINE MICROSCOPIC-ADD ON     Status: Abnormal   Collection Time   01/01/12  7:53 PM      Component Value Range   Squamous Epithelial / LPF MANY (*) RARE    WBC, UA 0-2  <3 (WBC/hpf)   Bacteria, UA FEW (*) RARE    Urine-Other MUCOUS PRESENT    CBC     Status: Abnormal   Collection Time   01/01/12  8:45 PM      Component Value Range   WBC 10.1  4.0 - 10.5 (K/uL)   RBC 3.88  3.87 - 5.11  (MIL/uL)   Hemoglobin 11.7 (*) 12.0 - 15.0 (g/dL)   HCT 11.9 (*) 14.7 - 46.0 (%)   MCV 89.4  78.0 - 100.0 (fL)   MCH 30.2  26.0 - 34.0 (pg)   MCHC 33.7  30.0 - 36.0 (g/dL)   RDW 82.9  56.2 - 13.0 (%)   Platelets 199  150 - 400 (K/uL)  COMPREHENSIVE METABOLIC PANEL     Status: Abnormal   Collection Time   01/01/12  8:45 PM      Component Value Range   Sodium 136  135 - 145 (mEq/L)   Potassium 3.6  3.5 - 5.1 (mEq/L)   Chloride 104  96 - 112 (mEq/L)   CO2 22  19 - 32 (mEq/L)   Glucose, Bld 139 (*) 70 - 99 (mg/dL)   BUN 7  6 - 23 (mg/dL)   Creatinine, Ser 8.65 (*) 0.50 - 1.10 (mg/dL)   Calcium 8.5  8.4 - 78.4 (mg/dL)   Total Protein 5.4 (*) 6.0 - 8.3 (g/dL)   Albumin 2.2 (*) 3.5 - 5.2 (g/dL)   AST 9  0 - 37 (U/L)   ALT 13  0 - 35 (U/L)   Alkaline Phosphatase 99  39 - 117 (U/L)   Total Bilirubin 0.1 (*) 0.3 - 1.2 (mg/dL)   GFR calc non Af Amer >90  >90 (mL/min)   GFR calc Af Amer >90  >90 (mL/min)  LACTATE DEHYDROGENASE     Status: Normal   Collection Time   01/01/12  8:45 PM      Component Value Range   LDH 144  94 - 250 (U/L)  URIC ACID     Status: Normal   Collection Time   01/01/12  8:45 PM      Component Value Range   Uric Acid, Serum 3.1  2.4 - 7.0 (mg/dL)    ED Course: HA resolved w/ Oxycodone. No floaters since arrival.  Imaging:  NA  Assessment: 1. Gestational hypertension vs pre-eclampsia, third trimester    Plan: D/C home Follow-up Information    Follow up with Levi Aland, MD. (as scheduled or MAU as needed if symptoms worsen)    Contact information:   [address] [place] [address] [phone]       Follow up  with Levi Aland, MD on 01/03/2012. (Turn in 24 hour urine)    Contact information:   [address] [place] [address] [phone]         Medication List  As of 01/01/2012 10:47 PM   CONTINUE taking these medications         acetaminophen 500 MG tablet    Commonly known as: TYLENOL      butalbital-acetaminophen-caffeine 50-325-40 MG per tablet   Commonly known as: FIORICET, ESGIC   Take 1-2 tablets by mouth every 6 (six) hours as needed for headache.      cetirizine 10 MG tablet   Commonly known as: ZYRTEC      FOLIC ACID PO      ranitidine 150 MG tablet   Commonly known as: ZANTAC          Increase rest and fluids. Collect 24 hour urine and bring to office Thursday. PIH, PTL precautions. FKCs  Dorathy Kinsman 01/01/2012 7:47 PM

## 2012-01-07 ENCOUNTER — Encounter (HOSPITAL_COMMUNITY): Payer: Self-pay | Admitting: *Deleted

## 2012-01-07 ENCOUNTER — Inpatient Hospital Stay (HOSPITAL_COMMUNITY)
Admission: AD | Admit: 2012-01-07 | Discharge: 2012-01-10 | DRG: 774 | Disposition: A | Discharge status: Home / Self Care | Payer: 59 | Source: Ambulatory Visit | Attending: Obstetrics and Gynecology | Admitting: Obstetrics and Gynecology

## 2012-01-07 LAB — CBC
HCT: 36.7 % (ref 36.0–46.0)
Hemoglobin: 12.6 g/dL (ref 12.0–15.0)
MCH: 30.7 pg (ref 26.0–34.0)
MCHC: 34.3 g/dL (ref 30.0–36.0)
MCV: 89.3 fL (ref 78.0–100.0)

## 2012-01-07 LAB — OB RESULTS CONSOLE GC/CHLAMYDIA: Gonorrhea: NEGATIVE

## 2012-01-07 MED ORDER — OXYTOCIN 20 UNITS IN LACTATED RINGERS INFUSION - SIMPLE
125.0000 mL/h | Freq: Once | INTRAVENOUS | Status: AC
  Administered 2012-01-08: 125 mL/h via INTRAVENOUS

## 2012-01-07 MED ORDER — LACTATED RINGERS IV SOLN: 500.0000 mL | INTRAVENOUS | Status: DC | PRN

## 2012-01-07 MED ORDER — LACTATED RINGERS IV SOLN: 500.0000 mL | Freq: Once | INTRAVENOUS | Status: DC

## 2012-01-07 MED ORDER — LIDOCAINE HCL (PF) 1 % IJ SOLN
30.0000 mL | INTRAMUSCULAR | Status: DC | PRN
  Filled 2012-01-07: qty 30

## 2012-01-07 MED ORDER — IBUPROFEN 600 MG PO TABS
600.0000 mg | ORAL_TABLET | Freq: Four times a day (QID) | ORAL | Status: DC | PRN
  Administered 2012-01-08: 600 mg via ORAL
  Filled 2012-01-07: qty 1

## 2012-01-07 MED ORDER — FENTANYL 2.5 MCG/ML BUPIVACAINE 1/10 % EPIDURAL INFUSION (WH - ANES): 14.0000 mL/h | INTRAMUSCULAR | Status: DC

## 2012-01-07 MED ORDER — SODIUM CHLORIDE 0.9 % IV SOLN
2.0000 g | Freq: Four times a day (QID) | INTRAVENOUS | Status: DC
  Administered 2012-01-07: 2 g via INTRAVENOUS
  Filled 2012-01-07 (×3): qty 2000

## 2012-01-07 MED ORDER — EPHEDRINE 5 MG/ML INJ: 10.0000 mg | INTRAVENOUS | Status: DC | PRN

## 2012-01-07 MED ORDER — FLEET ENEMA 7-19 GM/118ML RE ENEM: 1.0000 | ENEMA | RECTAL | Status: DC | PRN

## 2012-01-07 MED ORDER — ONDANSETRON HCL 4 MG/2ML IJ SOLN: 4.0000 mg | Freq: Four times a day (QID) | INTRAMUSCULAR | Status: DC | PRN

## 2012-01-07 MED ORDER — OXYTOCIN BOLUS FROM INFUSION
500.0000 mL | Freq: Once | INTRAVENOUS | Status: DC
  Filled 2012-01-07: qty 500
  Filled 2012-01-07: qty 1000

## 2012-01-07 MED ORDER — DIPHENHYDRAMINE HCL 50 MG/ML IJ SOLN: 12.5000 mg | INTRAMUSCULAR | Status: DC | PRN

## 2012-01-07 MED ORDER — LACTATED RINGERS IV SOLN: INTRAVENOUS | Status: DC

## 2012-01-07 MED ORDER — CITRIC ACID-SODIUM CITRATE 334-500 MG/5ML PO SOLN: 30.0000 mL | ORAL | Status: DC | PRN

## 2012-01-07 MED ORDER — PHENYLEPHRINE 40 MCG/ML (10ML) SYRINGE FOR IV PUSH (FOR BLOOD PRESSURE SUPPORT): 80.0000 ug | PREFILLED_SYRINGE | INTRAVENOUS | Status: DC | PRN

## 2012-01-07 MED ORDER — OXYCODONE-ACETAMINOPHEN 5-325 MG PO TABS: 1.0000 | ORAL_TABLET | ORAL | Status: DC | PRN

## 2012-01-07 MED ORDER — ACETAMINOPHEN 325 MG PO TABS: 650.0000 mg | ORAL_TABLET | ORAL | Status: DC | PRN

## 2012-01-07 NOTE — MAU Note (Signed)
Pt reports ROM at 2130, clear fluid, contractions

## 2012-01-08 ENCOUNTER — Encounter (HOSPITAL_COMMUNITY): Payer: Self-pay | Admitting: *Deleted

## 2012-01-08 LAB — COMPREHENSIVE METABOLIC PANEL
ALT: 16 U/L (ref 0–35)
AST: 14 U/L (ref 0–37)
Albumin: 2.5 g/dL — ABNORMAL LOW (ref 3.5–5.2)
Alkaline Phosphatase: 115 U/L (ref 39–117)
BUN: 8 mg/dL (ref 6–23)
Chloride: 102 mEq/L (ref 96–112)
Potassium: 3.9 mEq/L (ref 3.5–5.1)
Sodium: 136 mEq/L (ref 135–145)
Total Bilirubin: 0.2 mg/dL — ABNORMAL LOW (ref 0.3–1.2)

## 2012-01-08 LAB — CBC
Hemoglobin: 12.2 g/dL (ref 12.0–15.0)
MCHC: 34.2 g/dL (ref 30.0–36.0)
Platelets: 233 10*3/uL (ref 150–400)

## 2012-01-08 LAB — LACTATE DEHYDROGENASE: LDH: 222 U/L (ref 94–250)

## 2012-01-08 MED ORDER — ONDANSETRON HCL 4 MG PO TABS: 4.0000 mg | ORAL_TABLET | ORAL | Status: DC | PRN

## 2012-01-08 MED ORDER — DIBUCAINE 1 % RE OINT: 1.0000 "application " | TOPICAL_OINTMENT | RECTAL | Status: DC | PRN

## 2012-01-08 MED ORDER — IBUPROFEN 600 MG PO TABS
600.0000 mg | ORAL_TABLET | Freq: Four times a day (QID) | ORAL | Status: DC
  Administered 2012-01-08 – 2012-01-10 (×9): 600 mg via ORAL
  Filled 2012-01-08 (×9): qty 1

## 2012-01-08 MED ORDER — ZOLPIDEM TARTRATE 5 MG PO TABS: 5.0000 mg | ORAL_TABLET | Freq: Every evening | ORAL | Status: DC | PRN

## 2012-01-08 MED ORDER — PRENATAL MULTIVITAMIN CH
1.0000 | ORAL_TABLET | Freq: Every day | ORAL | Status: DC
  Administered 2012-01-08 – 2012-01-10 (×3): 1 via ORAL
  Filled 2012-01-08 (×3): qty 1

## 2012-01-08 MED ORDER — OXYCODONE-ACETAMINOPHEN 5-325 MG PO TABS
1.0000 | ORAL_TABLET | ORAL | Status: DC | PRN
  Administered 2012-01-08: 2 via ORAL
  Administered 2012-01-08 – 2012-01-09 (×2): 1 via ORAL
  Filled 2012-01-08: qty 2
  Filled 2012-01-08 (×2): qty 1

## 2012-01-08 MED ORDER — BENZOCAINE-MENTHOL 20-0.5 % EX AERO: 1.0000 "application " | INHALATION_SPRAY | CUTANEOUS | Status: DC | PRN

## 2012-01-08 MED ORDER — LANOLIN HYDROUS EX OINT: TOPICAL_OINTMENT | CUTANEOUS | Status: DC | PRN

## 2012-01-08 MED ORDER — TETANUS-DIPHTH-ACELL PERTUSSIS 5-2.5-18.5 LF-MCG/0.5 IM SUSP: 0.5000 mL | Freq: Once | INTRAMUSCULAR | Status: DC

## 2012-01-08 MED ORDER — WITCH HAZEL-GLYCERIN EX PADS: 1.0000 "application " | MEDICATED_PAD | CUTANEOUS | Status: DC | PRN

## 2012-01-08 MED ORDER — ONDANSETRON HCL 4 MG/2ML IJ SOLN: 4.0000 mg | INTRAMUSCULAR | Status: DC | PRN

## 2012-01-08 MED ORDER — DIPHENHYDRAMINE HCL 25 MG PO CAPS: 25.0000 mg | ORAL_CAPSULE | Freq: Four times a day (QID) | ORAL | Status: DC | PRN

## 2012-01-08 MED ORDER — SIMETHICONE 80 MG PO CHEW: 80.0000 mg | CHEWABLE_TABLET | ORAL | Status: DC | PRN

## 2012-01-08 MED ORDER — SENNOSIDES-DOCUSATE SODIUM 8.6-50 MG PO TABS
2.0000 | ORAL_TABLET | Freq: Every day | ORAL | Status: DC
  Administered 2012-01-08 – 2012-01-09 (×2): 2 via ORAL

## 2012-01-08 NOTE — Progress Notes (Signed)
Patient is eating, ambulating, voiding.  Pain control is good.  Filed Vitals:   01/08/12 0100 01/08/12 0115 01/08/12 0145 01/08/12 0220  BP: 136/71 134/69 132/85 142/81  Pulse: 100 93 88 84  Temp:    98.4 F (36.9 C)  TempSrc:    Oral  Resp:  20  20  Height:      Weight:      SpO2:        Fundus firm Perineum without swelling.  Lab Results  Component Value Date   WBC 18.1* 01/08/2012   HGB 12.2 01/08/2012   HCT 35.7* 01/08/2012   MCV 88.8 01/08/2012   PLT 233 01/08/2012    A/Positive/-- (11/11 0000)/RI  A/P Post partum day 0.  Routine care.  Expect d/c on the 16th.    Sherelle Castelli A

## 2012-01-09 NOTE — Progress Notes (Signed)
Post Partum Day 1 Subjective: no complaints  Objective: Blood pressure 137/87, pulse 76, temperature 97.9 F (36.6 C), temperature source Oral, resp. rate 18, height 5\' 3"  (1.6 m), weight 194 lb (87.998 kg),  breastfeeding.  Physical Exam:  General: alert Lochia: appropriate Uterine Fundus: firm   Basename 01/08/12 0558 01/07/12 2230  HGB 12.2 12.6  HCT 35.7* 36.7    Assessment/Plan: Plan for discharge tomorrow   LOS: 2 days   Norma [name] D 01/09/2012, 9:48 AM

## 2012-01-10 NOTE — Progress Notes (Signed)
Patient is eating, ambulating, voiding.  Pain control is good.  Filed Vitals:   01/09/12 0534 01/09/12 1351 01/09/12 2118 01/10/12 0559  BP: 137/87 144/83 135/83 131/78  Pulse: 76 88 83 83  Temp: 97.9 F (36.6 C) 98.7 F (37.1 C) 99 F (37.2 C) 98.7 F (37.1 C)  TempSrc: Oral Oral Oral Oral  Resp: 18 20 18 18   Height:      Weight:      SpO2:        Fundus firm Perineum without swelling.  Lab Results  Component Value Date   WBC 18.1* 01/08/2012   HGB 12.2 01/08/2012   HCT 35.7* 01/08/2012   MCV 88.8 01/08/2012   PLT 233 01/08/2012    A/Positive/-- (11/11 0000)/RI  A/P Post partum day 2.  Routine care.  Expect d/c today.    Jaliza Seifried A

## 2012-01-10 NOTE — Clinical Social Work Psychosocial (Signed)
    Clinical Social Work Department BRIEF PSYCHOSOCIAL ASSESSMENT 01/10/2012  Patient:  Norma [name], Norma [name]     Account Number:  [id number]     Admit date:  01/07/2012  Clinical Social Worker:  Andy Gauss  Date/Time:  01/10/2012 10:30 AM  Referred by:  Physician  Date Referred:  01/10/2012 Referred for  Behavioral Health Issues   Other Referral:   Hx PP depression   Interview type:  Patient Other interview type:    PSYCHOSOCIAL DATA Living Status:  HUSBAND Admitted from facility:   Level of care:   Primary support name:  Ludwig Lean Primary support relationship to patient:  SPOUSE Degree of support available:   Involved    CURRENT CONCERNS Current Concerns  Behavioral Health Issues   Other Concerns:    SOCIAL WORK ASSESSMENT / PLAN Pt acknowledges history of PP depression however contributes symptoms to her situation at that time. Pt explained that she was young and the FOB of her daughter (not this FOB) wasn't supportive.  She denies any depression symptoms since then, as she stated " I have a wonderful marriage."  Pt's spouse is at the bedside, aware of hx and supportive.  Pt agrees to seek medical attention if PP depression symptoms arise.  Sw will continue to follow and assist further if needed.   Assessment/plan status:  No Further Intervention Required Other assessment/ plan:   Information/referral to community resources:    PATIENT'S/FAMILY'S RESPONSE TO PLAN OF CARE:  Pt and spouse were very pleasant and receptive to consult.

## 2012-01-10 NOTE — Discharge Summary (Signed)
Obstetric Discharge Summary Reason for Admission: onset of labor Prenatal Procedures: none Intrapartum Procedures: spontaneous vaginal delivery Postpartum Procedures: none Complications-Operative and Postpartum: none Hemoglobin  Date Value Range Status  01/08/2012 12.2  12.0-15.0 (g/dL) Final     HCT  Date Value Range Status  01/08/2012 35.7* 36.0-46.0 (%) Final    Discharge Diagnoses: Term Pregnancy-delivered  Discharge Information: Date: 01/10/2012 Activity: pelvic rest Diet: routine Medications: Ibuprofen Condition: stable Instructions: refer to practice specific booklet Discharge to: home Follow-up Information    Follow up with Norma Corvino A, MD in 4 weeks.   Contact information:   [address] [place] [address] [phone]          Newborn Data: Live born female  Birth Weight: 5 lb 14 oz (2665 g) APGAR: 8, 9  Home with mother.  Norma [name] 01/10/2012, 8:41 AM

## 2012-01-15 ENCOUNTER — Encounter (HOSPITAL_COMMUNITY): Payer: 59

## 2012-01-19 NOTE — H&P (Signed)
Late Entry 26 y.o. [redacted]w[redacted]d  [id number] comes in c/o SROM @930p .  Otherwise has good fetal movement and no bleeding.  Past Medical History  Diagnosis Date  . No pertinent past medical history     Past Surgical History  Procedure Date  . Dilation and curettage of uterus     OB History    Grav Para Term Preterm Abortions TAB SAB Ect Mult Living   4 2 1 1 2  2   2      # Outc Date GA Lbr Len/2nd Wgt Sex Del Anes PTL Lv   1 TRM 2004    F SVD  Yes Yes   2 PRE 5/13 [redacted]w[redacted]d 03:06 / 00:02 5lb14oz(2.665kg) F SVD None  Yes   3 SAB            4 SAB               History   Social History  . Marital Status: Married    Spouse Name: N/A    Number of Children: N/A  . Years of Education: N/A   Occupational History  . Not on file.   Social History Main Topics  . Smoking status: Former Smoker    Types: Cigarettes    Quit date: 10/28/2008  . Smokeless tobacco: Not on file  . Alcohol Use: No  . Drug Use: No  . Sexually Active: Not Currently   Other Topics Concern  . Not on file   Social History Narrative  . No narrative on file   Review of patient's allergies indicates no known allergies.   Prenatal Course:  H/o PTL prior pregnancy, on 17-OHP otherwise uncomplicated  Filed Vitals:   01/10/12 0559  BP: 131/78  Pulse: 83  Temp: 98.7 F (37.1 C)  Resp: 18     Lungs/Cor:  NAD Abdomen:  soft, gravid Ex:  no cords, erythema SVE:  3/80/0 FHTs:  140, min to mod var, occ shallow variables Toco: q3-5 but not picking up   A/P   Admit in labor  GBS Neg  Epidural if desired  Other routine care  [place], Luther Parody

## 2013-05-13 ENCOUNTER — Other Ambulatory Visit: Payer: Self-pay | Admitting: Obstetrics and Gynecology

## 2013-12-04 ENCOUNTER — Encounter (HOSPITAL_COMMUNITY): Payer: Self-pay | Admitting: Emergency Medicine

## 2013-12-04 ENCOUNTER — Emergency Department (HOSPITAL_COMMUNITY)
Admission: EM | Admit: 2013-12-04 | Discharge: 2013-12-04 | Disposition: A | Discharge status: Home / Self Care | Payer: 59 | Source: Home / Self Care

## 2013-12-04 DIAGNOSIS — R05 Cough: Secondary | ICD-10-CM

## 2013-12-04 DIAGNOSIS — R059 Cough, unspecified: Secondary | ICD-10-CM

## 2013-12-04 DIAGNOSIS — R0982 Postnasal drip: Secondary | ICD-10-CM

## 2013-12-04 DIAGNOSIS — J309 Allergic rhinitis, unspecified: Secondary | ICD-10-CM

## 2013-12-04 NOTE — ED Provider Notes (Signed)
Medical screening examination/treatment/procedure(s) were performed by resident physician or non-physician practitioner and as supervising physician I was immediately available for consultation/collaboration.   Dorissa Stinnette DOUGLAS MD.   Yuritzi Kamp D Raya Mckinstry, MD 12/04/13 1259 

## 2013-12-04 NOTE — Discharge Instructions (Signed)
Allergic Rhinitis Allegra 180 mg a day flonase nasal spray as directed Sudafed PE 10 mg for congestion Robitussin DM or continue the Delsym  Cough, Adult  A cough is a reflex that helps clear your throat and airways. It can help heal the body or may be a reaction to an irritated airway. A cough may only last 2 or 3 weeks (acute) or may last more than 8 weeks (chronic).  CAUSES Acute cough:  Viral or bacterial infections. Chronic cough:  Infections.  Allergies.  Asthma.  Post-nasal drip.  Smoking.  Heartburn or acid reflux.  Some medicines.  Chronic lung problems (COPD).  Cancer. SYMPTOMS   Cough.  Fever.  Chest pain.  Increased breathing rate.  High-pitched whistling sound when breathing (wheezing).  Colored mucus that you cough up (sputum). TREATMENT   A bacterial cough may be treated with antibiotic medicine.  A viral cough must run its course and will not respond to antibiotics.  Your caregiver may recommend other treatments if you have a chronic cough. HOME CARE INSTRUCTIONS   Only take over-the-counter or prescription medicines for pain, discomfort, or fever as directed by your caregiver. Use cough suppressants only as directed by your caregiver.  Use a cold steam vaporizer or humidifier in your bedroom or home to help loosen secretions.  Sleep in a semi-upright position if your cough is worse at night.  Rest as needed.  Stop smoking if you smoke. SEEK IMMEDIATE MEDICAL CARE IF:   You have pus in your sputum.  Your cough starts to worsen.  You cannot control your cough with suppressants and are losing sleep.  You begin coughing up blood.  You have difficulty breathing.  You develop pain which is getting worse or is uncontrolled with medicine.  You have a fever. MAKE SURE YOU:   Understand these instructions.  Will watch your condition.  Will get help right away if you are not doing well or get worse. Document Released:  02/09/2011 Document Revised: 11/05/2011 Document Reviewed: 02/09/2011 [facility] Patient Information 2014 [place], [place].  Allergic rhinitis is when the mucous membranes in the nose respond to allergens. Allergens are particles in the air that cause your body to have an allergic reaction. This causes you to release allergic antibodies. Through a chain of events, these eventually cause you to release histamine into the blood stream. Although meant to protect the body, it is this release of histamine that causes your discomfort, such as frequent sneezing, congestion, and an itchy, runny nose.  CAUSES  Seasonal allergic rhinitis (hay fever) is caused by pollen allergens that may come from grasses, trees, and weeds. Year-round allergic rhinitis (perennial allergic rhinitis) is caused by allergens such as house dust mites, pet dander, and mold spores.  SYMPTOMS   Nasal stuffiness (congestion).  Itchy, runny nose with sneezing and tearing of the eyes. DIAGNOSIS  Your health care provider can help you determine the allergen or allergens that trigger your symptoms. If you and your health care provider are unable to determine the allergen, skin or blood testing may be used. TREATMENT  Allergic Rhinitis does not have a cure, but it can be controlled by:  Medicines and allergy shots (immunotherapy).  Avoiding the allergen. Hay fever may often be treated with antihistamines in pill or nasal spray forms. Antihistamines block the effects of histamine. There are over-the-counter medicines that may help with nasal congestion and swelling around the eyes. Check with your health care provider before taking or giving this medicine.  If  avoiding the allergen or the medicine prescribed do not work, there are many new medicines your health care provider can prescribe. Stronger medicine may be used if initial measures are ineffective. Desensitizing injections can be used if medicine and avoidance does not work.  Desensitization is when a patient is given ongoing shots until the body becomes less sensitive to the allergen. Make sure you follow up with your health care provider if problems continue. HOME CARE INSTRUCTIONS It is not possible to completely avoid allergens, but you can reduce your symptoms by taking steps to limit your exposure to them. It helps to know exactly what you are allergic to so that you can avoid your specific triggers. SEEK MEDICAL CARE IF:   You have a fever.  You develop a cough that does not stop easily (persistent).  You have shortness of breath.  You start wheezing.  Symptoms interfere with normal daily activities. Document Released: 05/08/2001 Document Revised: 06/03/2013 Document Reviewed: 04/20/2013 [facility] Patient Information 2014 [place].

## 2013-12-04 NOTE — ED Notes (Signed)
Cough for 7 weeks, has pain with coughing.

## 2013-12-04 NOTE — ED Provider Notes (Signed)
CSN: [id number]     Arrival date & time 12/04/13  1191 History   First MD Initiated Contact with Patient 12/04/13 [phone]     No chief complaint on file.  (Consider location/radiation/quality/duration/timing/severity/associated sxs/prior Treatment) HPI Comments: Cough x 7 weeks. Beginning to feel weak. Has been too busy to get checked.   Past Medical History  Diagnosis Date  . No pertinent past medical history    Past Surgical History  Procedure Laterality Date  . Dilation and curettage of uterus     Family History  Problem Relation Age of Onset  . Anesthesia problems Neg Hx   . Arthritis Father   . Hyperlipidemia Father   . Hypertension Father   . Mental illness Father   . Diabetes Maternal Grandmother   . Hyperlipidemia Maternal Grandfather   . Hyperlipidemia Paternal Grandfather    History  Substance Use Topics  . Smoking status: Former Smoker    Types: Cigarettes    Quit date: 10/28/2008  . Smokeless tobacco: Not on file  . Alcohol Use: No   OB History   Grav Para Term Preterm Abortions TAB SAB Ect Mult Living   4 2 1 1 2  2   2      Review of Systems  Constitutional: Positive for activity change and fatigue. Negative for fever, chills and appetite change.  HENT: Positive for congestion, ear pain, postnasal drip, rhinorrhea and sore throat. Negative for facial swelling.   Eyes: Negative.   Respiratory: Positive for cough. Negative for chest tightness and wheezing.   Cardiovascular: Negative.   Gastrointestinal: Negative.   Musculoskeletal: Negative for neck pain and neck stiffness.  Skin: Negative for pallor and rash.  Neurological: Negative.     Allergies  Review of patient's allergies indicates no known allergies.  Home Medications   Current Outpatient Rx  Name  Route  Sig  Dispense  Refill  . cetirizine (ZYRTEC) 10 MG tablet   Oral   Take 10 mg by mouth daily.         Marland Kitchen FOLIC ACID PO   Oral   Take 1 capsule by mouth at bedtime. Pt does not know  strength, gets OTC         . ranitidine (ZANTAC) 150 MG tablet   Oral   Take 150 mg by mouth at bedtime.          BP 117/65  Pulse 86  Temp(Src) 98.3 F (36.8 C) (Oral)  Resp 18  SpO2 95% Physical Exam  Nursing note and vitals reviewed. Constitutional: She is oriented to person, place, and time. She appears well-developed and well-nourished. No distress.  HENT:  Mouth/Throat: No oropharyngeal exudate.  Bilat TM's with minimal retraction, no redness or bulging. OP with red cobblestoning and clear PND  Eyes: Conjunctivae and EOM are normal.  Neck: Normal range of motion. Neck supple.  Cardiovascular: Normal rate, regular rhythm and normal heart sounds.   Pulmonary/Chest: Effort normal and breath sounds normal. No respiratory distress. She has no wheezes. She has no rales.  Musculoskeletal: Normal range of motion. She exhibits no edema.  Lymphadenopathy:    She has no cervical adenopathy.  Neurological: She is alert and oriented to person, place, and time.  Skin: Skin is warm and dry. No rash noted.  Psychiatric: She has a normal mood and affect.    ED Course  Procedures (including critical care time) Labs Review Labs Reviewed - No data to display Imaging Review No results found.   MDM  1. Allergic rhinitis   2. PND (post-nasal drip)   3. Cough    Sudafed, allegra, robitussin dm Plenty of fluids, flonase F/U with your PCP as needed    Hayden Rasmussenavid Vesna Kable, NP 12/04/13 [phone]

## 2014-06-28 ENCOUNTER — Encounter (HOSPITAL_COMMUNITY): Payer: Self-pay | Admitting: Emergency Medicine

## 2015-05-13 ENCOUNTER — Emergency Department (HOSPITAL_COMMUNITY)
Admission: EM | Admit: 2015-05-13 | Discharge: 2015-05-14 | Disposition: A | Discharge status: Home / Self Care | Payer: 59 | Attending: Emergency Medicine | Admitting: Emergency Medicine

## 2015-05-13 ENCOUNTER — Encounter (HOSPITAL_COMMUNITY): Payer: Self-pay | Admitting: *Deleted

## 2015-05-13 DIAGNOSIS — Z79899 Other long term (current) drug therapy: Secondary | ICD-10-CM | POA: Insufficient documentation

## 2015-05-13 DIAGNOSIS — R42 Dizziness and giddiness: Secondary | ICD-10-CM | POA: Diagnosis not present

## 2015-05-13 DIAGNOSIS — R51 Headache: Secondary | ICD-10-CM | POA: Diagnosis present

## 2015-05-13 DIAGNOSIS — Z3202 Encounter for pregnancy test, result negative: Secondary | ICD-10-CM | POA: Diagnosis not present

## 2015-05-13 DIAGNOSIS — Z87891 Personal history of nicotine dependence: Secondary | ICD-10-CM | POA: Insufficient documentation

## 2015-05-13 DIAGNOSIS — R5381 Other malaise: Secondary | ICD-10-CM | POA: Diagnosis not present

## 2015-05-13 DIAGNOSIS — R5383 Other fatigue: Secondary | ICD-10-CM | POA: Insufficient documentation

## 2015-05-13 LAB — HEPATIC FUNCTION PANEL
ALT: 38 U/L (ref 14–54)
AST: 21 U/L (ref 15–41)
Albumin: 3.9 g/dL (ref 3.5–5.0)
Alkaline Phosphatase: 56 U/L (ref 38–126)
BILIRUBIN TOTAL: 0.5 mg/dL (ref 0.3–1.2)
Total Protein: 7.3 g/dL (ref 6.5–8.1)

## 2015-05-13 LAB — CBC WITH DIFFERENTIAL/PLATELET
BASOS PCT: 0 %
Basophils Absolute: 0 10*3/uL (ref 0.0–0.1)
EOS ABS: 0 10*3/uL (ref 0.0–0.7)
EOS PCT: 0 %
HCT: 47.1 % — ABNORMAL HIGH (ref 36.0–46.0)
HEMOGLOBIN: 16.2 g/dL — AB (ref 12.0–15.0)
Lymphocytes Relative: 32 %
Lymphs Abs: 3.7 10*3/uL (ref 0.7–4.0)
MCH: 30.5 pg (ref 26.0–34.0)
MCHC: 34.4 g/dL (ref 30.0–36.0)
MCV: 88.5 fL (ref 78.0–100.0)
MONOS PCT: 7 %
Monocytes Absolute: 0.8 10*3/uL (ref 0.1–1.0)
NEUTROS PCT: 61 %
Neutro Abs: 7.2 10*3/uL (ref 1.7–7.7)
PLATELETS: 233 10*3/uL (ref 150–400)
RBC: 5.32 MIL/uL — ABNORMAL HIGH (ref 3.87–5.11)
RDW: 12.6 % (ref 11.5–15.5)
WBC: 11.8 10*3/uL — AB (ref 4.0–10.5)

## 2015-05-13 LAB — TROPONIN I: Troponin I: <0.03 ng/mL (ref <0.031)

## 2015-05-13 LAB — BASIC METABOLIC PANEL
Anion gap: 10 (ref 5–15)
BUN: 13 mg/dL (ref 6–20)
CALCIUM: 9.5 mg/dL (ref 8.9–10.3)
CHLORIDE: 103 mmol/L (ref 101–111)
CO2: 23 mmol/L (ref 22–32)
CREATININE: 0.81 mg/dL (ref 0.44–1.00)
Glucose, Bld: 97 mg/dL (ref 65–99)
Potassium: 3.5 mmol/L (ref 3.5–5.1)
SODIUM: 136 mmol/L (ref 135–145)

## 2015-05-13 LAB — URINALYSIS, ROUTINE W REFLEX MICROSCOPIC
BILIRUBIN URINE: NEGATIVE
GLUCOSE, UA: NEGATIVE mg/dL
KETONES UR: NEGATIVE mg/dL
Nitrite: NEGATIVE
PROTEIN: NEGATIVE mg/dL
Specific Gravity, Urine: 1.026 (ref 1.005–1.030)
UROBILINOGEN UA: 0.2 mg/dL (ref 0.0–1.0)
pH: 5 (ref 5.0–8.0)

## 2015-05-13 LAB — RAPID URINE DRUG SCREEN, HOSP PERFORMED
AMPHETAMINES: NOT DETECTED
BARBITURATES: NOT DETECTED
BENZODIAZEPINES: NOT DETECTED
COCAINE: NOT DETECTED
OPIATES: NOT DETECTED
TETRAHYDROCANNABINOL: NOT DETECTED

## 2015-05-13 LAB — POC URINE PREG, ED: PREG TEST UR: NEGATIVE

## 2015-05-13 LAB — URINE MICROSCOPIC-ADD ON

## 2015-05-13 MED ORDER — ACETAMINOPHEN 500 MG PO TABS
1000.0000 mg | ORAL_TABLET | Freq: Once | ORAL | Status: AC
  Administered 2015-05-13: 1000 mg via ORAL
  Filled 2015-05-13: qty 2

## 2015-05-13 NOTE — ED Provider Notes (Signed)
CSN: [id number]     Arrival date & time 05/13/15  2009 History   First MD Initiated Contact with Patient 05/13/15 2058     Chief Complaint  Patient presents with  . Hypertension  . Headache     (Consider location/radiation/quality/duration/timing/severity/associated sxs/prior Treatment) HPI Patient reports that she was at work and she began to feel achy and and got a frontal headache. She just started to feel bad. Someone else took her blood pressure and her blood pressure was elevated in the 150s. She came concern for elevated blood pressure and came to the emergency department. The patient does not have a history of hypertension. She has felt some additional achiness and fatigue over the past day. She has not had a documented fever. Past Medical History  Diagnosis Date  . No pertinent past medical history    Past Surgical History  Procedure Laterality Date  . Dilation and curettage of uterus     Family History  Problem Relation Age of Onset  . Anesthesia problems Neg Hx   . Arthritis Father   . Hyperlipidemia Father   . Hypertension Father   . Mental illness Father   . Diabetes Maternal Grandmother   . Hyperlipidemia Maternal Grandfather   . Hyperlipidemia Paternal Grandfather    Social History  Substance Use Topics  . Smoking status: Former Smoker    Types: Cigarettes    Quit date: 10/28/2008  . Smokeless tobacco: Never Used  . Alcohol Use: No   OB History    Gravida Para Term Preterm AB TAB SAB Ectopic Multiple Living   Review of Systems 10 Systems reviewed and are negative for acute change except as noted in the HPI.    Allergies  Review of patient's allergies indicates no known allergies.  Home Medications   Prior to Admission medications   Medication Sig Start Date End Date Taking? Authorizing Provider  cetirizine (ZYRTEC) 10 MG tablet Take 10 mg by mouth daily.    Historical Provider, MD  dextromethorphan (DELSYM) 30 MG/5ML liquid  Take by mouth as needed for cough.    Historical Provider, MD  etonogestrel (IMPLANON) 68 MG IMPL implant Inject 1 each into the skin once.    Historical Provider, MD  FOLIC ACID PO Take 1 capsule by mouth at bedtime. Pt does not know strength, gets OTC    Historical Provider, MD  guaiFENesin (MUCINEX) 600 MG 12 hr tablet Take by mouth 2 (two) times daily.    Historical Provider, MD  ranitidine (ZANTAC) 150 MG tablet Take 150 mg by mouth at bedtime.    Historical Provider, MD   BP 114/66 mmHg  Pulse 92  Temp(Src) 98.8 F (37.1 C) (Oral)  Resp 23  Ht  (1.575 m)  Wt 180 lb (81.647 kg)  BMI 32.91 kg/m2  SpO2 95% Physical Exam  Constitutional: She is oriented to person, place, and time. She appears well-developed and well-nourished.  HENT:  Head: Normocephalic and atraumatic.  Eyes: EOM are normal. Pupils are equal, round, and reactive to light.  Neck: Neck supple.  Cardiovascular: Normal rate, regular rhythm, normal heart sounds and intact distal pulses.   Pulmonary/Chest: Effort normal and breath sounds normal.  Abdominal: Soft. Bowel sounds are normal. She exhibits no distension. There is no tenderness.  Musculoskeletal: Normal range of motion. She exhibits no edema.  Neurological: She is alert and oriented to person, place, and time. She has normal  strength. Coordination normal. GCS eye subscore is 4. GCS verbal subscore is 5. GCS motor subscore is 6.  Skin: Skin is warm, dry and intact.  Psychiatric: She has a normal mood and affect.    ED Course  Procedures (including critical care time) Labs Review Labs Reviewed  CBC WITH DIFFERENTIAL/PLATELET - Abnormal; Notable for the following:    WBC 11.8 (*)    RBC 5.32 (*)    Hemoglobin 16.2 (*)    HCT 47.1 (*)    All other components within normal limits  URINALYSIS, ROUTINE W REFLEX MICROSCOPIC (NOT AT [facility]) - Abnormal; Notable for the following:    APPearance CLOUDY (*)    Hgb urine dipstick TRACE (*)    Leukocytes, UA  SMALL (*)    All other components within normal limits  HEPATIC FUNCTION PANEL - Abnormal; Notable for the following:    Bilirubin, Direct <0.1 (*)    All other components within normal limits  URINE MICROSCOPIC-ADD ON - Abnormal; Notable for the following:    Squamous Epithelial / LPF MANY (*)    Bacteria, UA MANY (*)    All other components within normal limits  BASIC METABOLIC PANEL  TROPONIN I  URINE RAPID DRUG SCREEN, HOSP PERFORMED  POC URINE PREG, ED  POC URINE PREG, ED    Imaging Review No results found. I have personally reviewed and evaluated these images and lab results as part of my medical decision-making.  EKG: Normal sinus rhythm, no ST-T wave changes, normal EKG interpreted by Talina Pleitez  MDM   Final diagnoses:  Episodic lightheadedness  Malaise and fatigue   Patient presents with concern for hypertension with onset of malaise and headache. At this time the patient's blood pressure is normal with normal physical examination. History is more suggestive of possible early viral syndrome with some nonspecific constitutional symptoms and no fever. She is well at this point time and diagnostic workup does not show any concerning findings. Patient is counseled for follow-up examination and sinus symptoms for return.    Arby Barrette, MD 05/13/15 [phone]

## 2015-05-13 NOTE — Discharge Instructions (Signed)
Dizziness °Dizziness is a common problem. It is a feeling of unsteadiness or light-headedness. You may feel like you are about to faint. Dizziness can lead to injury if you stumble or fall. A person of any age group can suffer from dizziness, but dizziness is more common in older adults. °CAUSES  °Dizziness can be caused by many different things, including: °· Middle ear problems. °· Standing for too long. °· Infections. °· An allergic reaction. °· Aging. °· An emotional response to something, such as the sight of blood. °· Side effects of medicines. °· Tiredness. °· Problems with circulation or blood pressure. °· Excessive use of alcohol or medicines, or illegal drug use. °· Breathing too fast (hyperventilation). °· An irregular heart rhythm (arrhythmia). °· A low red blood cell count (anemia). °· Pregnancy. °· Vomiting, diarrhea, fever, or other illnesses that cause body fluid loss (dehydration). °· Diseases or conditions such as Parkinson's disease, high blood pressure (hypertension), diabetes, and thyroid problems. °· Exposure to extreme heat. °DIAGNOSIS  °Your health care provider will ask about your symptoms, perform a physical exam, and perform an electrocardiogram (ECG) to record the electrical activity of your heart. Your health care provider may also perform other heart or blood tests to determine the cause of your dizziness. These may include: °· Transthoracic echocardiogram (TTE). During echocardiography, sound waves are used to evaluate how blood flows through your heart. °· Transesophageal echocardiogram (TEE). °· Cardiac monitoring. This allows your health care provider to monitor your heart rate and rhythm in real time. °· Holter monitor. This is a portable device that records your heartbeat and can help diagnose heart arrhythmias. It allows your health care provider to track your heart activity for several days if needed. °· Stress tests by exercise or by giving medicine that makes the heart beat  faster. °TREATMENT  °Treatment of dizziness depends on the cause of your symptoms and can vary greatly. °HOME CARE INSTRUCTIONS  °· Drink enough fluids to keep your urine clear or pale yellow. This is especially important in very hot weather. In older adults, it is also important in cold weather. °· Take your medicine exactly as directed if your dizziness is caused by medicines. When taking blood pressure medicines, it is especially important to get up slowly. °· Rise slowly from chairs and steady yourself until you feel okay. °· In the morning, first sit up on the side of the bed. When you feel okay, stand slowly while holding onto something until you know your balance is fine. °· Move your legs often if you need to stand in one place for a long time. Tighten and relax your muscles in your legs while standing. °· Have someone stay with you for 1-2 days if dizziness continues to be a problem. Do this until you feel you are well enough to stay alone. Have the person call your health care provider if he or she notices changes in you that are concerning. °· Do not drive or use heavy machinery if you feel dizzy. °· Do not drink alcohol. °SEEK IMMEDIATE MEDICAL CARE IF:  °· Your dizziness or light-headedness gets worse. °· You feel nauseous or vomit. °· You have problems talking, walking, or using your arms, hands, or legs. °· You feel weak. °· You are not thinking clearly or you have trouble forming sentences. It may take a friend or family member to notice this. °· You have chest pain, abdominal pain, shortness of breath, or sweating. °· Your vision changes. °· You notice   any bleeding. °· You have side effects from medicine that seems to be getting worse rather than better. °MAKE SURE YOU:  °· Understand these instructions. °· Will watch your condition. °· Will get help right away if you are not doing well or get worse. °Document Released: 02/06/2001 Document Revised: 08/18/2013 Document Reviewed: 03/02/2011 °[employer]®  Patient Information ©2015 [employer]. This information is not intended to replace advice given to you by your health care provider. Make sure you discuss any questions you have with your health care provider. ° °Weakness °Weakness is a lack of strength. It may be felt all over the body (generalized) or in one specific part of the body (focal). Some causes of weakness can be serious. You may need further medical evaluation, especially if you are elderly or you have a history of immunosuppression (such as chemotherapy or HIV), kidney disease, heart disease, or diabetes. °CAUSES  °Weakness can be caused by many different things, including: °· Infection. °· Physical exhaustion. °· Internal bleeding or other blood loss that results in a lack of red blood cells (anemia). °· Dehydration. This cause is more common in elderly people. °· Side effects or electrolyte abnormalities from medicines, such as pain medicines or sedatives. °· Emotional distress, anxiety, or depression. °· Circulation problems, especially severe peripheral arterial disease. °· Heart disease, such as rapid atrial fibrillation, bradycardia, or heart failure. °· Nervous system disorders, such as Guillain-Barré syndrome, multiple sclerosis, or stroke. °DIAGNOSIS  °To find the cause of your weakness, your caregiver will take your history and perform a physical exam. Lab tests or X-rays may also be ordered, if needed. °TREATMENT  °Treatment of weakness depends on the cause of your symptoms and can vary greatly. °HOME CARE INSTRUCTIONS  °· Rest as needed. °· Eat a well-balanced diet. °· Try to get some exercise every day. °· Only take over-the-counter or prescription medicines as directed by your caregiver. °SEEK MEDICAL CARE IF:  °· Your weakness seems to be getting worse or spreads to other parts of your body. °· You develop new aches or pains. °SEEK IMMEDIATE MEDICAL CARE IF:  °· You cannot perform your normal daily activities, such as getting dressed  and feeding yourself. °· You cannot walk up and down stairs, or you feel exhausted when you do so. °· You have shortness of breath or chest pain. °· You have difficulty moving parts of your body. °· You have weakness in only one area of the body or on only one side of the body. °· You have a fever. °· You have trouble speaking or swallowing. °· You cannot control your bladder or bowel movements. °· You have black or bloody vomit or stools. °MAKE SURE YOU: °· Understand these instructions. °· Will watch your condition. °· Will get help right away if you are not doing well or get worse. °Document Released: 08/13/2005 Document Revised: 02/12/2012 Document Reviewed: 10/12/2011 °[employer]® Patient Information ©2015 [employer]. This information is not intended to replace advice given to you by your health care provider. Make sure you discuss any questions you have with your health care provider. ° °

## 2015-05-13 NOTE — ED Notes (Signed)
Patient presents stating she has had a headache, eyes "not right", BP elevated.  Stated she was at work and was not feeling right

## 2015-05-13 NOTE — ED Notes (Signed)
Family at bedside. 

## 2015-05-14 NOTE — ED Notes (Signed)
Discharge instructions reviewed with patient. Understanding verbalized. Patient declined wheelchair at time of discharge. No acute distress noted. 

## 2015-06-14 ENCOUNTER — Other Ambulatory Visit: Payer: Self-pay | Admitting: Obstetrics and Gynecology

## 2015-06-15 LAB — CYTOLOGY - PAP

## 2015-09-23 ENCOUNTER — Emergency Department (HOSPITAL_COMMUNITY)
Admission: EM | Admit: 2015-09-23 | Discharge: 2015-09-23 | Disposition: A | Discharge status: Home / Self Care | Payer: 59 | Attending: Emergency Medicine | Admitting: Emergency Medicine

## 2015-09-23 ENCOUNTER — Emergency Department (INDEPENDENT_AMBULATORY_CARE_PROVIDER_SITE_OTHER)
Admission: EM | Admit: 2015-09-23 | Discharge: 2015-09-23 | Disposition: A | Discharge status: Home / Self Care | Payer: 59 | Source: Home / Self Care | Attending: Family Medicine | Admitting: Family Medicine

## 2015-09-23 ENCOUNTER — Encounter (HOSPITAL_COMMUNITY): Payer: Self-pay | Admitting: Family Medicine

## 2015-09-23 ENCOUNTER — Encounter (HOSPITAL_COMMUNITY): Payer: Self-pay | Admitting: *Deleted

## 2015-09-23 ENCOUNTER — Emergency Department (HOSPITAL_COMMUNITY): Payer: 59

## 2015-09-23 DIAGNOSIS — M549 Dorsalgia, unspecified: Secondary | ICD-10-CM | POA: Insufficient documentation

## 2015-09-23 DIAGNOSIS — K219 Gastro-esophageal reflux disease without esophagitis: Secondary | ICD-10-CM | POA: Insufficient documentation

## 2015-09-23 DIAGNOSIS — Z793 Long term (current) use of hormonal contraceptives: Secondary | ICD-10-CM | POA: Insufficient documentation

## 2015-09-23 DIAGNOSIS — R079 Chest pain, unspecified: Secondary | ICD-10-CM | POA: Diagnosis not present

## 2015-09-23 DIAGNOSIS — M79602 Pain in left arm: Secondary | ICD-10-CM | POA: Diagnosis not present

## 2015-09-23 DIAGNOSIS — R0789 Other chest pain: Secondary | ICD-10-CM | POA: Insufficient documentation

## 2015-09-23 DIAGNOSIS — Z87891 Personal history of nicotine dependence: Secondary | ICD-10-CM | POA: Diagnosis not present

## 2015-09-23 DIAGNOSIS — Z79899 Other long term (current) drug therapy: Secondary | ICD-10-CM | POA: Insufficient documentation

## 2015-09-23 LAB — BASIC METABOLIC PANEL
Anion gap: 9 (ref 5–15)
BUN: 11 mg/dL (ref 6–20)
CALCIUM: 9.6 mg/dL (ref 8.9–10.3)
CHLORIDE: 106 mmol/L (ref 101–111)
CO2: 25 mmol/L (ref 22–32)
CREATININE: 0.69 mg/dL (ref 0.44–1.00)
GFR calc non Af Amer: >60 mL/min (ref >60)
GLUCOSE: 82 mg/dL (ref 65–99)
Potassium: 3.5 mmol/L (ref 3.5–5.1)
Sodium: 140 mmol/L (ref 135–145)

## 2015-09-23 LAB — CBC
HCT: 43.9 % (ref 36.0–46.0)
HEMOGLOBIN: 15.3 g/dL — AB (ref 12.0–15.0)
MCH: 30.7 pg (ref 26.0–34.0)
MCHC: 34.9 g/dL (ref 30.0–36.0)
MCV: 88.2 fL (ref 78.0–100.0)
PLATELETS: 313 10*3/uL (ref 150–400)
RBC: 4.98 MIL/uL (ref 3.87–5.11)
RDW: 12.5 % (ref 11.5–15.5)
WBC: 11 10*3/uL — ABNORMAL HIGH (ref 4.0–10.5)

## 2015-09-23 LAB — I-STAT TROPONIN, ED
TROPONIN I, POC: 0 ng/mL (ref 0.00–0.08)
TROPONIN I, POC: 0 ng/mL (ref 0.00–0.08)

## 2015-09-23 LAB — D-DIMER, QUANTITATIVE: D-Dimer, Quant: <0.27 ug/mL-FEU (ref 0.00–0.50)

## 2015-09-23 MED ORDER — ASPIRIN 325 MG PO TABS
325.0000 mg | ORAL_TABLET | Freq: Once | ORAL | Status: AC
  Administered 2015-09-23: 325 mg via ORAL
  Filled 2015-09-23: qty 1

## 2015-09-23 NOTE — ED Provider Notes (Signed)
CSN: [id number]     Arrival date & time 09/23/15  1502 History   First MD Initiated Contact with Patient 09/23/15 1611     Chief Complaint  Patient presents with  . Chest Pain  . Back Pain     (Consider location/radiation/quality/duration/timing/severity/associated sxs/prior Treatment) The history is provided by the patient.  Norma [name] is a 30 y.o. female with hx of GERD, family hx of MI here with chest pain. Patient works as Chief Technology Officer. Patient states that she has been having left arm pain for several days. Patient woke up from her nap today and had acute onset of left sided chest pain. It is a pressure like feeling. Took her heart burn mediations but didn't help. Pain radiate to her back and somewhat pleuritic. Denies shortness of breath. Denies fever or chills. Patient has significant family hx of MI. Father had MI in 6s and aunt had bypass age 36s. Has no personal history of MI or CAD or HTN or DM.    Past Medical History  Diagnosis Date  . No pertinent past medical history    Past Surgical History  Procedure Laterality Date  . Dilation and curettage of uterus     Family History  Problem Relation Age of Onset  . Anesthesia problems Neg Hx   . Arthritis Father   . Hyperlipidemia Father   . Hypertension Father   . Mental illness Father   . Diabetes Maternal Grandmother   . Hyperlipidemia Maternal Grandfather   . Hyperlipidemia Paternal Grandfather    Social History  Substance Use Topics  . Smoking status: Former Smoker    Types: Cigarettes    Quit date: 10/28/2008  . Smokeless tobacco: Never Used  . Alcohol Use: No   OB History    Gravida Para Term Preterm AB TAB SAB Ectopic Multiple Living   Review of Systems  Cardiovascular: Positive for chest pain.  Musculoskeletal: Positive for back pain.  All other systems reviewed and are negative.     Allergies  Review of patient's allergies indicates no known  allergies.  Home Medications   Prior to Admission medications   Medication Sig Start Date End Date Taking? Authorizing Provider  cetirizine (ZYRTEC) 10 MG tablet Take 10 mg by mouth daily.   Yes Historical Provider, MD  etonogestrel (NEXPLANON) 68 MG IMPL implant 1 each by Subdermal route once.   Yes Historical Provider, MD  ranitidine (ZANTAC) 150 MG tablet Take 150 mg by mouth at bedtime.   Yes Historical Provider, MD   BP 114/72 mmHg  Pulse 100  Temp(Src) 98.1 F (36.7 C) (Oral)  Resp 22  SpO2 98% Physical Exam  Constitutional: She is oriented to person, place, and time. She appears well-developed and well-nourished.  HENT:  Mouth/Throat: Oropharynx is clear and moist.  Eyes: Conjunctivae are normal. Pupils are equal, round, and reactive to light.  Neck: Normal range of motion. Neck supple.  Cardiovascular: Normal rate, regular rhythm and normal heart sounds.   Pulmonary/Chest: Effort normal and breath sounds normal. No respiratory distress. She has no wheezes. She has no rales.  Abdominal: Soft. Bowel sounds are normal. She exhibits no distension. There is no tenderness. There is no rebound.  Musculoskeletal: Normal range of motion. She exhibits no edema or tenderness.  Neurological: She is alert and [id number] to person, place, and time.  Skin: Skin is warm and dry.  Psychiatric: She has a  normal mood and affect. Her behavior is normal. Judgment and thought content normal.  Nursing note and vitals reviewed.   ED Course  Procedures (including critical care time) Labs Review Labs Reviewed  CBC - Abnormal; Notable for the following:    WBC 11.0 (*)    Hemoglobin 15.3 (*)    All other components within normal limits  BASIC METABOLIC PANEL  D-DIMER, QUANTITATIVE (NOT AT [facility])  Rosezena Sensor, ED  Rosezena Sensor, ED    Imaging Review Dg Chest 2 View  09/23/2015  CLINICAL DATA:  Two day history of chest pain EXAM: CHEST  2 VIEW COMPARISON:  May 28, 2007 FINDINGS:  Lungs are clear. Heart size and pulmonary vascularity are normal. No adenopathy. No pneumothorax. No bone lesions. IMPRESSION: No edema or consolidation. Electronically Signed   By: Bretta Bang III M.D.   On: 09/23/2015 15:40   I have personally reviewed and evaluated these images and lab results as part of my medical decision-making.   EKG Interpretation   Date/Time:  Friday September 23 2015 16:32:01 EST Ventricular Rate:  88 PR Interval:  128 QRS Duration: 92 QT Interval:  351 QTC Calculation: 425 R Axis:   55 Text Interpretation:  Sinus rhythm Borderline T abnormalities, inferior  leads No significant change since last tracing Confirmed by Aleathia Purdy  MD, Diasia Henken  ([id number]) on 09/23/2015 4:36:26 PM      MDM   Final diagnoses:  None    Norma [name] is a 30 y.o. female here with chest pain. Has no personal hx of CAD but has significant family hx. Will get labs, delta trop, will give ASA and reassess. She is on birth control and is borderline tachycardic. Will get d-dimer to r/o PE as well.   7:57 PM D-dimer neg. CXR nl. Trop neg x 2. Pain free. Recommend ASA 81 mg daily and follow up outpatient to get stress test given family hx.    Richardean Canal, MD 09/23/15 [phone]

## 2015-09-23 NOTE — ED Provider Notes (Signed)
CSN: [id number]     Arrival date & time 09/23/15  1307 History   None    Chief Complaint  Patient presents with  . Chest Pain   (Consider location/radiation/quality/duration/timing/severity/associated sxs/prior Treatment) Patient is a 30 y.o. female presenting with chest pain. The history is provided by the patient.  Chest Pain Pain location:  L chest Pain quality: pressure   Pain radiates to:  L arm Pain radiates to the back: no   Pain severity:  Moderate Onset quality:  Sudden Duration:  6 hours Progression:  Waxing and waning Chronicity:  New Context: no movement and not raising an arm   Ineffective treatments:  Antacids Associated symptoms: anxiety and heartburn   Associated symptoms: no cough, no fever, no nausea, no orthopnea, no palpitations, no shortness of breath and not vomiting   Associated symptoms comment:  Does have gerd problem.   Past Medical History  Diagnosis Date  . No pertinent past medical history    Past Surgical History  Procedure Laterality Date  . Dilation and curettage of uterus     Family History  Problem Relation Age of Onset  . Anesthesia problems Neg Hx   . Arthritis Father   . Hyperlipidemia Father   . Hypertension Father   . Mental illness Father   . Diabetes Maternal Grandmother   . Hyperlipidemia Maternal Grandfather   . Hyperlipidemia Paternal Grandfather    Social History  Substance Use Topics  . Smoking status: Former Smoker    Types: Cigarettes    Quit date: 10/28/2008  . Smokeless tobacco: Never Used  . Alcohol Use: No   OB History    Gravida Para Term Preterm AB TAB SAB Ectopic Multiple Living   Review of Systems  Constitutional: Negative.  Negative for fever.  Respiratory: Negative for cough and shortness of breath.   Cardiovascular: Positive for chest pain. Negative for palpitations, orthopnea and leg swelling.  Gastrointestinal: Positive for heartburn. Negative for nausea and vomiting.   Skin: Negative.   All other systems reviewed and are negative.   Allergies  Review of patient's allergies indicates no known allergies.  Home Medications   Prior to Admission medications   Medication Sig Start Date End Date Taking? Authorizing Provider  cetirizine (ZYRTEC) 10 MG tablet Take 10 mg by mouth daily.    Historical Provider, MD  etonogestrel (NEXPLANON) 68 MG IMPL implant 1 each by Subdermal route once.    Historical Provider, MD  ranitidine (ZANTAC) 150 MG tablet Take 150 mg by mouth at bedtime.    Historical Provider, MD   Meds Ordered and Administered this Visit  Medications - No data to display  BP 130/86 mmHg  Pulse 72  Temp(Src) 98.6 F (37 C) (Oral)  Resp 18  SpO2 100% No data found.   Physical Exam  Constitutional: She is oriented to person, place, and time. She appears well-developed and well-nourished. No distress.  Neck: Normal range of motion. Neck supple. No thyromegaly present.  Cardiovascular: Normal rate, normal heart sounds and intact distal pulses.   Pulmonary/Chest: Effort normal and breath sounds normal. She exhibits no tenderness.  Lymphadenopathy:    She has no cervical adenopathy.  Neurological: She is alert and oriented to person, place, and time.  Skin: Skin is warm and dry.  Nursing note and vitals reviewed.   ED Course  Procedures (including critical care time)  Labs Review Labs Reviewed - No data  to display  Imaging Review No results found.   Visual Acuity Review  Right Eye Distance:   Left Eye Distance:   Bilateral Distance:    Right Eye Near:   Left Eye Near:    Bilateral Near:     ED ECG REPORT   Date: 09/23/2015  Rate: 84  Rhythm: normal sinus rhythm  QRS Axis: normal  Intervals: normal  ST/T Wave abnormalities: normal  Conduction Disutrbances:none  Narrative Interpretation: wnl.  Old EKG Reviewed: none available  I have personally reviewed the EKG tracing and agree with the computerized printout as  noted.     MDM   1. Atypical chest pain    Sent for further eval of atypical left chest pain with left arm aching. Strong family cardiac hx. H/o smoking.    Linna Hoff, MD 09/28/15 2101

## 2015-09-23 NOTE — ED Notes (Signed)
Pt  Reports     Chest   Pain    As  Well  As  l  Arm  Pain     With  Symptoms  X  2  Days        pt  denys  Any  History  Of  Any  Similar   Episodes       Pt  Reports    Symptoms  Of   Not  releived  By    Pain meds       And  Heartburn   Pills  -          Pt  Is  Awake   And  Alert   Oriented        Appearing  In  No  Acute  Distress      Skin is  Warm  And  Dry

## 2015-09-23 NOTE — ED Notes (Signed)
Pt here for constant left arm pain, and tinges in chest. sts also pain with breathing between shoulder blades. sts tingling in her fingers.

## 2015-09-23 NOTE — Discharge Instructions (Signed)
Take ASA 81 mg daily.   See your doctor. You may need a stress test.   Return to ER if you have worse chest pain, shortness of breath, arm pain.

## 2015-09-26 DIAGNOSIS — M546 Pain in thoracic spine: Secondary | ICD-10-CM | POA: Diagnosis not present

## 2015-09-26 DIAGNOSIS — G5600 Carpal tunnel syndrome, unspecified upper limb: Secondary | ICD-10-CM | POA: Diagnosis not present

## 2016-04-14 DIAGNOSIS — H60502 Unspecified acute noninfective otitis externa, left ear: Secondary | ICD-10-CM | POA: Diagnosis not present

## 2016-04-15 DIAGNOSIS — H6092 Unspecified otitis externa, left ear: Secondary | ICD-10-CM | POA: Diagnosis not present

## 2017-09-24 ENCOUNTER — Ambulatory Visit: Payer: 59 | Admitting: Adult Health

## 2017-10-21 NOTE — Progress Notes (Deleted)
   Subjective:    Patient ID: Norma [name], female    DOB: 01/06/1986, 32 y.o.   MRN: [id number]  HPI:  Ms. Norma [name] is here to establish as a new pt.  She is a pleasant 32 year old female.  PMH:    Review of Systems     Objective:   Physical Exam        Assessment & Plan:

## 2017-10-23 ENCOUNTER — Ambulatory Visit: Payer: Self-pay | Admitting: Adult Health

## 2022-03-17 ENCOUNTER — Ambulatory Visit
Admission: EM | Admit: 2022-03-17 | Discharge: 2022-03-17 | Disposition: A | Discharge status: Home / Self Care | Payer: BLUE CROSS/BLUE SHIELD | Attending: Physician Assistant | Admitting: Physician Assistant

## 2022-03-17 ENCOUNTER — Other Ambulatory Visit: Payer: Self-pay

## 2022-03-17 ENCOUNTER — Encounter: Payer: Self-pay | Admitting: Emergency Medicine

## 2022-03-17 DIAGNOSIS — H60502 Unspecified acute noninfective otitis externa, left ear: Secondary | ICD-10-CM

## 2022-03-17 MED ORDER — CIPROFLOXACIN-DEXAMETHASONE 0.3-0.1 % OT SUSP: 4.0000 [drp] | Freq: Two times a day (BID) | OTIC | 0 refills | Status: AC

## 2022-03-17 NOTE — ED Triage Notes (Signed)
Pt here for left ear pain and fullness x 3 days

## 2022-03-17 NOTE — ED Provider Notes (Signed)
[name]    CSN: [id number] Arrival date & time: 03/17/22  1246      History   Chief Complaint Chief Complaint  Patient presents with   Ear Fullness    Ear ache, a lot of pain for 3 days. - Entered by patient    HPI Norma [name] is a 36 y.o. female.   Patient here today for evaluation of left ear pain and fullness that she has had the last 3 days. She reports that she has taken tylenol and ibuprofen with temporary relief. She has not had any congestion, sore throat or cough. She denies fever.   The history is provided by the patient.  Ear Fullness Pertinent negatives include no abdominal pain.    Past Medical History:  Diagnosis Date   No pertinent past medical history     There are no problems to display for this patient.   Past Surgical History:  Procedure Laterality Date   DILATION AND CURETTAGE OF UTERUS      OB History     Gravida  4   Para  2   Term  1   Preterm  1   AB  2   Living  2      SAB  2   IAB      Ectopic      Multiple      Live Births  2            Home Medications    Prior to Admission medications   Medication Sig Start Date End Date Taking? Authorizing Provider  ciprofloxacin-dexamethasone (CIPRODEX) OTIC suspension Place 4 drops into the left ear 2 (two) times daily for 7 days. 03/17/22 03/24/22 Yes [name], PA-C  cetirizine (ZYRTEC) 10 MG tablet Take 10 mg by mouth daily.    [provider]  etonogestrel (NEXPLANON) 68 MG IMPL implant 1 each by Subdermal route once.    [provider]  ranitidine (ZANTAC) 150 MG tablet Take 150 mg by mouth at bedtime.    [provider]    Family History Family History  Problem Relation Age of Onset   Anesthesia problems Neg Hx    Arthritis Father    Hyperlipidemia Father    Hypertension Father    Mental illness Father    Diabetes Maternal Grandmother    Hyperlipidemia Maternal Grandfather    Hyperlipidemia  Paternal Grandfather     Social History Social History   Tobacco Use   Smoking status: Former    Types: Cigarettes    Quit date: 10/28/2008    Years since quitting: 13.3   Smokeless tobacco: Never  Substance Use Topics   Alcohol use: No   Drug use: No     Allergies   Patient has no known allergies.   Review of Systems Review of Systems  Constitutional:  Negative for chills and fever.  HENT:  Positive for ear pain. Negative for congestion and sore throat.   Eyes:  Negative for discharge and redness.  Respiratory:  Negative for cough.   Gastrointestinal:  Positive for nausea (when pain is severe). Negative for abdominal pain and vomiting.     Physical Exam Triage Vital Signs ED Triage Vitals [03/17/22 1316]  Enc Vitals Group     BP (!) 146/85     Pulse Rate 87     Resp 18     Temp 98.1 F (36.7 C)     Temp Source Oral  SpO2 98 %     Weight      Height      Head Circumference      Peak Flow      Pain Score 6     Pain Loc      Pain Edu?      Excl. in GC?    No data found.  Updated Vital Signs BP (!) 146/85 (BP Location: Left Arm)   Pulse 87   Temp 98.1 F (36.7 C) (Oral)   Resp 18   SpO2 98%      Physical Exam Vitals and nursing note reviewed.  Constitutional:      General: She is not in acute distress.    Appearance: Normal appearance. She is not ill-appearing.  HENT:     Head: Normocephalic and atraumatic.     Right Ear: Tympanic membrane and ear canal normal.     Ears:     Comments: Left EAC inflamed with white exudate, TM not visualized Eyes:     Conjunctiva/sclera: Conjunctivae normal.  Cardiovascular:     Rate and Rhythm: Normal rate.  Pulmonary:     Effort: Pulmonary effort is normal.  Neurological:     Mental Status: She is alert.  Psychiatric:        Mood and Affect: Mood normal.        Behavior: Behavior normal.        Thought Content: Thought content normal.      UC Treatments / Results  Labs (all labs ordered are  listed, but only abnormal results are displayed) Labs Reviewed - No data to display  EKG   Radiology No results found.  Procedures Procedures (including critical care time)  Medications Ordered in UC Medications - No data to display  Initial Impression / Assessment and Plan / UC Course  I have reviewed the triage vital signs and the nursing notes.  Pertinent labs & imaging results that were available during my care of the patient were reviewed by me and considered in my medical decision making (see chart for details).    Ciprodex drops prescribed. Recommended tylenol and or ibuprofen if needed for pain relief.  Encouraged follow up with any further concerns.   Final Clinical Impressions(s) / UC Diagnoses   Final diagnoses:  Acute otitis externa of left ear, unspecified type   Discharge Instructions   None    ED Prescriptions     Medication Sig Dispense Auth. Provider   ciprofloxacin-dexamethasone (CIPRODEX) OTIC suspension Place 4 drops into the left ear 2 (two) times daily for 7 days. 7.5 mL [name], PA-C      PDMP not reviewed this encounter.   [name], PA-C 03/17/22 1326

## 2022-03-19 ENCOUNTER — Ambulatory Visit
Admission: RE | Admit: 2022-03-19 | Discharge: 2022-03-19 | Disposition: A | Discharge status: Home / Self Care | Payer: BLUE CROSS/BLUE SHIELD | Source: Ambulatory Visit | Attending: Internal Medicine | Admitting: Internal Medicine

## 2022-03-19 VITALS — BP 157/108 | HR 89 | Temp 98.1°F | Resp 18

## 2022-03-19 DIAGNOSIS — H60392 Other infective otitis externa, left ear: Secondary | ICD-10-CM

## 2022-03-19 MED ORDER — AMOXICILLIN-POT CLAVULANATE 875-125 MG PO TABS: 1.0000 | ORAL_TABLET | Freq: Two times a day (BID) | ORAL | 0 refills | Status: AC

## 2022-03-19 NOTE — ED Provider Notes (Signed)
[name]    CSN: [id number] Arrival date & time: 03/19/22  1449      History   Chief Complaint Chief Complaint  Patient presents with   Ear Fullness    I was just seen on Saturday for swimmers ear.  I have been on antibiotic ear drops this is day 3 and I'm still in horrible pain.  Can't sleep can't eat I'm miserable. - Entered by patient    HPI Norma [name] is a 36 y.o. female.   Patient presents with persistent left ear pain.  Patient was seen on 03/17/2022 and diagnosed with otitis externa.  She was put on Ciprodex antibiotic drops.  She reports that symptoms have worsened and not improved.  Denies any associated fevers.  She has taken ibuprofen for pain.   Ear Fullness    Past Medical History:  Diagnosis Date   No pertinent past medical history     There are no problems to display for this patient.   Past Surgical History:  Procedure Laterality Date   DILATION AND CURETTAGE OF UTERUS      OB History     Gravida  4   Para  2   Term  1   Preterm  1   AB  2   Living  2      SAB  2   IAB      Ectopic      Multiple      Live Births  2            Home Medications    Prior to Admission medications   Medication Sig Start Date End Date Taking? Authorizing Provider  amoxicillin-clavulanate (AUGMENTIN) 875-125 MG tablet Take 1 tablet by mouth every 12 (twelve) hours. 03/19/22  Yes Kristine Tiley, Acie Fredrickson, FNP  cetirizine (ZYRTEC) 10 MG tablet Take 10 mg by mouth daily.    [provider]  ciprofloxacin-dexamethasone (CIPRODEX) OTIC suspension Place 4 drops into the left ear 2 (two) times daily for 7 days. 03/17/22 03/24/22  [name], PA-C  etonogestrel (NEXPLANON) 68 MG IMPL implant 1 each by Subdermal route once.    [provider]  ranitidine (ZANTAC) 150 MG tablet Take 150 mg by mouth at bedtime.    [provider]    Family History Family History  Problem Relation Age of Onset   Anesthesia  problems Neg Hx    Arthritis Father    Hyperlipidemia Father    Hypertension Father    Mental illness Father    Diabetes Maternal Grandmother    Hyperlipidemia Maternal Grandfather    Hyperlipidemia Paternal Grandfather     Social History Social History   Tobacco Use   Smoking status: Former    Types: Cigarettes    Quit date: 10/28/2008    Years since quitting: 13.3   Smokeless tobacco: Never  Substance Use Topics   Alcohol use: No   Drug use: No     Allergies   Patient has no known allergies.   Review of Systems Review of Systems Per HPI  Physical Exam Triage Vital Signs ED Triage Vitals [03/19/22 1538]  Enc Vitals Group     BP (!) 157/108     Pulse Rate 89     Resp 18     Temp 98.1 F (36.7 C)     Temp src      SpO2 97 %     Weight      Height  Head Circumference      Peak Flow      Pain Score 5     Pain Loc      Pain Edu?      Excl. in GC?    No data found.  Updated Vital Signs BP (!) 157/108   Pulse 89   Temp 98.1 F (36.7 C)   Resp 18   SpO2 97%   Visual Acuity Right Eye Distance:   Left Eye Distance:   Bilateral Distance:    Right Eye Near:   Left Eye Near:    Bilateral Near:     Physical Exam Constitutional:      General: She is not in acute distress.    Appearance: Normal appearance. She is not toxic-appearing or diaphoretic.  HENT:     Head: Normocephalic and atraumatic.     Left Ear: External ear normal. Drainage, swelling and tenderness present.     Ears:     Comments: Unable to fully visualize TM.  Mild exudate and swelling noted to the left external canal. Eyes:     Extraocular Movements: Extraocular movements intact.     Conjunctiva/sclera: Conjunctivae normal.  Pulmonary:     Effort: Pulmonary effort is normal.  Neurological:     General: No focal deficit present.     Mental Status: She is alert and oriented to person, place, and time. Mental status is at baseline.  Psychiatric:        Mood and Affect: Mood  normal.        Behavior: Behavior normal.        Thought Content: Thought content normal.        Judgment: Judgment normal.      UC Treatments / Results  Labs (all labs ordered are listed, but only abnormal results are displayed) Labs Reviewed - No data to display  EKG   Radiology No results found.  Procedures Procedures (including critical care time)  Medications Ordered in UC Medications - No data to display  Initial Impression / Assessment and Plan / UC Course  I have reviewed the triage vital signs and the nursing notes.  Pertinent labs & imaging results that were available during my care of the patient were reviewed by me and considered in my medical decision making (see chart for details).     It appears the patient has persistent left otitis externa.  Swelling and exudate is minimal.  Unable to fully visualize the TM but it appears intact and not erythematous.  Given that symptoms have been refractory to topical medication, will treat with oral antibiotics.  Patient to follow-up with provided contact information for ENT specialty if symptoms persist or worsen.  Discussed supportive care.  Patient to continue and complete Ciprodex antibiotic drops as well.  Patient verbalized understanding and was agreeable with plan. Final Clinical Impressions(s) / UC Diagnoses   Final diagnoses:  Other infective acute otitis externa of left ear     Discharge Instructions      An oral antibiotic has been added to your regimen.  Please follow-up with ear, nose, throat specialist at provided contact information if symptoms persist or worsen.    ED Prescriptions     Medication Sig Dispense Auth. Provider   amoxicillin-clavulanate (AUGMENTIN) 875-125 MG tablet Take 1 tablet by mouth every 12 (twelve) hours. 14 tablet [place], Acie Fredrickson, [place]      PDMP not reviewed this encounter.   Gustavus Bryant, [place] 03/19/22 1547

## 2022-03-19 NOTE — ED Triage Notes (Signed)
Pt is present today with severe left ear pain. Pt states that she feels like the ear drops that she was prescribed are not working. Pt states she now feels sharp pains radiating through her ear canal.

## 2022-03-19 NOTE — Discharge Instructions (Signed)
An oral antibiotic has been added to your regimen.  Please follow-up with ear, nose, throat specialist at provided contact information if symptoms persist or worsen.
